# Patient Record
Sex: Female | Born: 1940 | Hispanic: Yes | Smoking: Former smoker
Health system: Southern US, Community
[De-identification: ages and names within clinical notes are randomized; demographics above are authoritative.]

## PROBLEM LIST (undated history)

## (undated) DIAGNOSIS — I10 Essential (primary) hypertension: Secondary | ICD-10-CM

## (undated) DIAGNOSIS — M810 Age-related osteoporosis without current pathological fracture: Secondary | ICD-10-CM

## (undated) HISTORY — PX: DILATION AND CURETTAGE OF UTERUS: SHX78

## (undated) HISTORY — PX: TONSILLECTOMY: SUR1361

---

## 2018-02-11 ENCOUNTER — Encounter (HOSPITAL_COMMUNITY): Payer: Self-pay | Admitting: *Deleted

## 2018-02-11 ENCOUNTER — Inpatient Hospital Stay (HOSPITAL_COMMUNITY)
Admission: EM | Admit: 2018-02-11 | Discharge: 2018-02-14 | DRG: 544 | Disposition: A | Discharge status: Skilled Nursing Facility | Payer: Medicare Other | Attending: Internal Medicine | Admitting: Internal Medicine

## 2018-02-11 ENCOUNTER — Other Ambulatory Visit: Payer: Self-pay

## 2018-02-11 ENCOUNTER — Emergency Department (HOSPITAL_COMMUNITY): Payer: Medicare Other

## 2018-02-11 DIAGNOSIS — M80872A Other osteoporosis with current pathological fracture, left ankle and foot, initial encounter for fracture: Secondary | ICD-10-CM | POA: Diagnosis present

## 2018-02-11 DIAGNOSIS — Z87891 Personal history of nicotine dependence: Secondary | ICD-10-CM | POA: Diagnosis not present

## 2018-02-11 DIAGNOSIS — W010XXA Fall on same level from slipping, tripping and stumbling without subsequent striking against object, initial encounter: Secondary | ICD-10-CM | POA: Diagnosis present

## 2018-02-11 DIAGNOSIS — I10 Essential (primary) hypertension: Secondary | ICD-10-CM | POA: Diagnosis present

## 2018-02-11 DIAGNOSIS — Z87310 Personal history of (healed) osteoporosis fracture: Secondary | ICD-10-CM

## 2018-02-11 DIAGNOSIS — Z885 Allergy status to narcotic agent status: Secondary | ICD-10-CM | POA: Diagnosis not present

## 2018-02-11 DIAGNOSIS — W19XXXA Unspecified fall, initial encounter: Secondary | ICD-10-CM

## 2018-02-11 DIAGNOSIS — M80051A Age-related osteoporosis with current pathological fracture, right femur, initial encounter for fracture: Secondary | ICD-10-CM | POA: Diagnosis not present

## 2018-02-11 DIAGNOSIS — M80851A Other osteoporosis with current pathological fracture, right femur, initial encounter for fracture: Secondary | ICD-10-CM | POA: Diagnosis present

## 2018-02-11 DIAGNOSIS — Z79899 Other long term (current) drug therapy: Secondary | ICD-10-CM | POA: Diagnosis not present

## 2018-02-11 DIAGNOSIS — S92355A Nondisplaced fracture of fifth metatarsal bone, left foot, initial encounter for closed fracture: Secondary | ICD-10-CM | POA: Diagnosis not present

## 2018-02-11 DIAGNOSIS — T402X5A Adverse effect of other opioids, initial encounter: Secondary | ICD-10-CM | POA: Diagnosis not present

## 2018-02-11 DIAGNOSIS — S32591A Other specified fracture of right pubis, initial encounter for closed fracture: Secondary | ICD-10-CM

## 2018-02-11 DIAGNOSIS — S329XXA Fracture of unspecified parts of lumbosacral spine and pelvis, initial encounter for closed fracture: Secondary | ICD-10-CM | POA: Diagnosis present

## 2018-02-11 DIAGNOSIS — Y92230 Patient room in hospital as the place of occurrence of the external cause: Secondary | ICD-10-CM | POA: Diagnosis not present

## 2018-02-11 DIAGNOSIS — K5900 Constipation, unspecified: Secondary | ICD-10-CM | POA: Diagnosis not present

## 2018-02-11 DIAGNOSIS — R110 Nausea: Secondary | ICD-10-CM | POA: Diagnosis not present

## 2018-02-11 DIAGNOSIS — M80072A Age-related osteoporosis with current pathological fracture, left ankle and foot, initial encounter for fracture: Secondary | ICD-10-CM | POA: Diagnosis not present

## 2018-02-11 HISTORY — DX: Essential (primary) hypertension: I10

## 2018-02-11 HISTORY — DX: Age-related osteoporosis without current pathological fracture: M81.0

## 2018-02-11 LAB — I-STAT CHEM 8, ED
BUN: 21 mg/dL (ref 8–23)
Calcium, Ion: 1.19 mmol/L (ref 1.15–1.40)
Chloride: 98 mmol/L (ref 98–111)
Creatinine, Ser: 0.8 mg/dL (ref 0.44–1.00)
GLUCOSE: 102 mg/dL — AB (ref 70–99)
HEMATOCRIT: 41 % (ref 36.0–46.0)
Hemoglobin: 13.9 g/dL (ref 12.0–15.0)
Potassium: 4 mmol/L (ref 3.5–5.1)
SODIUM: 137 mmol/L (ref 135–145)
TCO2: 31 mmol/L (ref 22–32)

## 2018-02-11 LAB — CBC WITH DIFFERENTIAL/PLATELET
Abs Immature Granulocytes: 0.1 10*3/uL (ref 0.0–0.1)
BASOS ABS: 0 10*3/uL (ref 0.0–0.1)
BASOS PCT: 0 %
EOS ABS: 0.1 10*3/uL (ref 0.0–0.7)
EOS PCT: 0 %
HCT: 41.3 % (ref 36.0–46.0)
HEMOGLOBIN: 13.4 g/dL (ref 12.0–15.0)
Immature Granulocytes: 1 %
Lymphocytes Relative: 10 %
Lymphs Abs: 1.2 10*3/uL (ref 0.7–4.0)
MCH: 30.7 pg (ref 26.0–34.0)
MCHC: 32.4 g/dL (ref 30.0–36.0)
MCV: 94.5 fL (ref 78.0–100.0)
MONO ABS: 0.6 10*3/uL (ref 0.1–1.0)
Monocytes Relative: 5 %
Neutro Abs: 10.6 10*3/uL — ABNORMAL HIGH (ref 1.7–7.7)
Neutrophils Relative %: 84 %
PLATELETS: 234 10*3/uL (ref 150–400)
RBC: 4.37 MIL/uL (ref 3.87–5.11)
RDW: 14.3 % (ref 11.5–15.5)
WBC: 12.6 10*3/uL — AB (ref 4.0–10.5)

## 2018-02-11 MED ORDER — FENTANYL CITRATE (PF) 100 MCG/2ML IJ SOLN
50.0000 ug | Freq: Once | INTRAMUSCULAR | Status: AC
  Administered 2018-02-11: 50 ug via INTRAVENOUS
  Filled 2018-02-11: qty 2

## 2018-02-11 MED ORDER — SENNA 8.6 MG PO TABS
1.0000 | ORAL_TABLET | Freq: Two times a day (BID) | ORAL | Status: DC
  Administered 2018-02-12 – 2018-02-13 (×3): 8.6 mg via ORAL
  Filled 2018-02-11 (×3): qty 1

## 2018-02-11 MED ORDER — HYDROCHLOROTHIAZIDE 25 MG PO TABS
25.0000 mg | ORAL_TABLET | Freq: Every day | ORAL | Status: DC
  Administered 2018-02-12 – 2018-02-14 (×3): 25 mg via ORAL
  Filled 2018-02-11 (×3): qty 1

## 2018-02-11 MED ORDER — POLYETHYLENE GLYCOL 3350 17 G PO PACK
17.0000 g | PACK | Freq: Every day | ORAL | Status: DC | PRN
  Administered 2018-02-13 – 2018-02-14 (×2): 17 g via ORAL
  Filled 2018-02-11 (×2): qty 1

## 2018-02-11 MED ORDER — LISINOPRIL 20 MG PO TABS
20.0000 mg | ORAL_TABLET | Freq: Every day | ORAL | Status: DC
Start: 2018-02-12 — End: 2018-02-14
  Administered 2018-02-12 – 2018-02-14 (×3): 20 mg via ORAL
  Filled 2018-02-11 (×3): qty 1

## 2018-02-11 MED ORDER — ENOXAPARIN SODIUM 40 MG/0.4ML ~~LOC~~ SOLN
40.0000 mg | SUBCUTANEOUS | Status: DC
  Administered 2018-02-12 – 2018-02-13 (×2): 40 mg via SUBCUTANEOUS
  Filled 2018-02-11 (×2): qty 0.4

## 2018-02-11 MED ORDER — ONDANSETRON HCL 4 MG/2ML IJ SOLN
4.0000 mg | Freq: Four times a day (QID) | INTRAMUSCULAR | Status: DC | PRN
  Administered 2018-02-11: 4 mg via INTRAVENOUS
  Filled 2018-02-11: qty 2

## 2018-02-11 MED ORDER — HYDROCODONE-ACETAMINOPHEN 10-325 MG PO TABS
0.5000 | ORAL_TABLET | ORAL | Status: DC | PRN
  Administered 2018-02-12: 1 via ORAL
  Administered 2018-02-12: 0.5 via ORAL
  Administered 2018-02-12: 1 via ORAL
  Administered 2018-02-12: 0.5 via ORAL
  Administered 2018-02-13 (×2): 1 via ORAL
  Administered 2018-02-13: 0.5 via ORAL
  Filled 2018-02-11: qty 1
  Filled 2018-02-11: qty 2
  Filled 2018-02-11 (×5): qty 1

## 2018-02-11 MED ORDER — HYDROMORPHONE HCL 1 MG/ML IJ SOLN
0.5000 mg | INTRAMUSCULAR | Status: DC | PRN
  Administered 2018-02-11: 0.5 mg via INTRAVENOUS
  Filled 2018-02-11: qty 1

## 2018-02-11 MED ORDER — ONDANSETRON HCL 4 MG PO TABS: 4.0000 mg | ORAL_TABLET | Freq: Four times a day (QID) | ORAL | Status: DC | PRN

## 2018-02-11 MED ORDER — LISINOPRIL-HYDROCHLOROTHIAZIDE 20-25 MG PO TABS: 1.0000 | ORAL_TABLET | Freq: Every day | ORAL | Status: DC

## 2018-02-11 NOTE — Progress Notes (Signed)
Orthopedic Tech Progress Note Patient Details:  Angelica ChessmanMaria Gutterman 12/09/1940 [id number]  Ortho Devices Type of Ortho Device: Postop shoe/boot Ortho Device/Splint Location: lle Ortho Device/Splint Interventions: Application   Post Interventions Patient Tolerated: Well Instructions Provided: Care of device   Nikki DomCrawford, Tonio Seider 02/11/2018, 4:46 PM

## 2018-02-11 NOTE — ED Triage Notes (Signed)
Pt in via [facility] from home, pt reports falling from standing position today onto Curb, family got the pt up and drove her to the house, PTAR was called and pt was brought in c/o R hip pain, vitals WNL, A&O x4

## 2018-02-11 NOTE — Consult Note (Signed)
Reason for Consult:Pelvic/foot injuries Referring Physician: E Jimmey RalphRees  Carnell Huffman is an 77 y.o. female.  HPI: Pamela HesselbachMaria stepped off a curb and twisted her ankle and fell onto her right side. She was unable to get up or ambulate and had significant left foot pain. She was brought to the ED where x-rays showed right pubic rami fxs and a left 5th MT base fx and orthopedic surgery was consulted.  Past Medical History:  Diagnosis Date  . Hypertension   . Osteoporosis     Past Surgical History:  Procedure Laterality Date  . DILATION AND CURETTAGE OF UTERUS    . TONSILLECTOMY      No family history on file.  Social History:  reports that she quit smoking about 29 years ago. Her smoking use included cigarettes. She has never used smokeless tobacco. She reports that she drank alcohol. She reports that she does not use drugs.  Allergies:  Allergies  Allergen Reactions  . Codeine Nausea Only    Medications: I have reviewed the patient's current medications.  Results for orders placed or performed during the hospital encounter of 02/11/18 (from the past 48 hour(s))  CBC with Differential/Platelet     Status: Abnormal   Collection Time: 02/11/18 12:49 PM  Result Value Ref Range   WBC 12.6 (H) 4.0 - 10.5 K/uL   RBC 4.37 3.87 - 5.11 MIL/uL   Hemoglobin 13.4 12.0 - 15.0 g/dL   HCT 47.841.3 29.536.0 - 62.146.0 %   MCV 94.5 78.0 - 100.0 fL   MCH 30.7 26.0 - 34.0 pg   MCHC 32.4 30.0 - 36.0 g/dL   RDW 30.814.3 65.711.5 - 84.615.5 %   Platelets 234 150 - 400 K/uL   Neutrophils Relative % 84 %   Neutro Abs 10.6 (H) 1.7 - 7.7 K/uL   Lymphocytes Relative 10 %   Lymphs Abs 1.2 0.7 - 4.0 K/uL   Monocytes Relative 5 %   Monocytes Absolute 0.6 0.1 - 1.0 K/uL   Eosinophils Relative 0 %   Eosinophils Absolute 0.1 0.0 - 0.7 K/uL   Basophils Relative 0 %   Basophils Absolute 0.0 0.0 - 0.1 K/uL   Immature Granulocytes 1 %   Abs Immature Granulocytes 0.1 0.0 - 0.1 K/uL    Comment: Performed at [facility],  [address] [place], [place] [address]  I-stat chem 8, ed     Status: Abnormal   Collection Time: 02/11/18  1:42 PM  Result Value Ref Range   Sodium 137 135 - 145 mmol/L   Potassium 4.0 3.5 - 5.1 mmol/L   Chloride 98 98 - 111 mmol/L   BUN 21 8 - 23 mg/dL   Creatinine, Ser 2.840.80 0.44 - 1.00 mg/dL   Glucose, Bld 132102 (H) 70 - 99 mg/dL   Calcium, Ion 4.401.19 1.021.15 - 1.40 mmol/L   TCO2 31 22 - 32 mmol/L   Hemoglobin 13.9 12.0 - 15.0 g/dL   HCT 72.541.0 36.636.0 - 44.046.0 %    Dg Foot Complete Left  Result Date: 02/11/2018 CLINICAL DATA:  77 year old who fell and complains of pain involving the LATERAL LEFT foot localizing to the fifth metatarsal region. Associated bruising and swelling. Initial encounter. EXAM: LEFT FOOT - COMPLETE 3+ VIEW COMPARISON:  None. FINDINGS: Acute nondisplaced fracture involving the base of the fifth metatarsal. No fractures elsewhere. Mild joint space narrowing and associated spurring involving the first MTP joint. Remaining joint spaces well preserved for patient age. Mild osseous demineralization. Very small plantar  calcaneal spur. IMPRESSION: Acute nondisplaced fracture involving the base of the fifth metatarsal. Electronically Signed   By: Hulan Saas M.D.   On: 02/11/2018 13:18   Dg Hip Unilat  With Pelvis 2-3 Views Right  Result Date: 02/11/2018 CLINICAL DATA:  77 year old who fell and complains of RIGHT hip pain. Initial encounter. EXAM: DG HIP (WITH OR WITHOUT PELVIS) 2-3V RIGHT COMPARISON:  None. FINDINGS: Acute fracture involving the RIGHT pubic bone with likely involvement of the MEDIAL aspect of the SUPERIOR and INFERIOR pubic rami. No fractures elsewhere. RIGHT hip joint anatomically aligned with mild joint space narrowing. Included AP pelvis demonstrates symmetric mild joint space narrowing in the contralateral LEFT hip. Sacroiliac joints intact. Symphysis pubis intact with degenerative changes. Degenerative changes involving the visualized LOWER lumbar spine.  IMPRESSION: Acute fracture involving the RIGHT pubic bone with likely involvement of the MEDIAL aspect of the SUPERIOR and INFERIOR pubic rami. Electronically Signed   By: Hulan Saas M.D.   On: 02/11/2018 13:16    Review of Systems  Constitutional: Negative for weight loss.  HENT: Negative for ear discharge, ear pain, hearing loss and tinnitus.   Eyes: Negative for blurred vision, double vision, photophobia and pain.  Respiratory: Negative for cough, sputum production and shortness of breath.   Cardiovascular: Negative for chest pain.  Gastrointestinal: Negative for abdominal pain, nausea and vomiting.  Genitourinary: Negative for dysuria, flank pain, frequency and urgency.  Musculoskeletal: Positive for joint pain (Left foot, pelvis). Negative for back pain, falls, myalgias and neck pain.  Neurological: Negative for dizziness, tingling, sensory change, focal weakness, loss of consciousness and headaches.  Endo/Heme/Allergies: Does not bruise/bleed easily.  Psychiatric/Behavioral: Negative for depression, memory loss and substance abuse. The patient is not nervous/anxious.    Blood pressure (!) 159/61, pulse 73, temperature 98.1 F (36.7 C), temperature source Oral, resp. rate 18, SpO2 97 %. Physical Exam  Constitutional: She appears well-developed and well-nourished. No distress.  HENT:  Head: Normocephalic and atraumatic.  Eyes: Conjunctivae are normal. Right eye exhibits no discharge. Left eye exhibits no discharge. No scleral icterus.  Neck: Normal range of motion.  Cardiovascular: Normal rate and regular rhythm.  Respiratory: Effort normal. No respiratory distress.  Musculoskeletal:  Pelvis--no traumatic wounds or rash, no ecchymosis, stable to manual stress, mild TTP with AP/lat compression  RLE No traumatic wounds or rash  Mod TTP lateral foot, ecchymotic  No knee or ankle effusion  Knee stable to varus/ valgus and anterior/posterior stress  Sens DPN, SPN, TN  intact  Motor EHL, ext, flex, evers 5/5  DP 2+, PT 1+, No significant edema  Neurological: She is alert.  Skin: Skin is warm and dry. She is not diaphoretic.  Psychiatric: She has a normal mood and affect. Her behavior is normal.    Assessment/Plan: Fall Right sup/inf rami fxs -- No treatment necessary beyond pain control. May WBAT RLE. Left 5th MT fx -- WBAT in post-op shoe. Should f/u with Dr. Magnus Ivan at discharge.    Freeman Caldron, PA-C Orthopedic Surgery [phone] 02/11/2018, 2:38 PM

## 2018-02-11 NOTE — H&P (Addendum)
Date: 02/11/2018               Patient Name:  Pamela Huffman MRN: [id number]  DOB: 1940-10-23 Age / Sex: 77 y.o., female   PCP: No primary care provider on file.              Medical Service: Internal Medicine Teaching Service              Attending Physician: Dr. Burns Spain, MD    First Contact: Lindie Spruce, Pamela  Pager: [phone]  Second Contact: Dr. Samuella Cota  Pager: [phone]            After Hours (After 5p/  First Contact Pager: [phone]  weekends / holidays): Second Contact Pager: [phone]   Chief Complaint: Hip and foot pain after fall   History of Present Illness: Pamela Huffman is a 24 yoF with a history of osteoporosis with 4 vertebral fractures in 2013, and hypertension who presents after twisting her foot on a curb and falling on her right hip. She states that this occurred 4-5 hours ago and that as a result of the fall, she injured her left foot and her right hip during the fall. She denies hitting her head, losing consciousness before or after the fall, feeling dizzy or like things were going black before she fell, palpitations or chest pain associated with the fall. She denies any diarrhea, vomiting or sickness in the days preceding her fall and states that before she fell, she was in her usual state of health. She claims she is currently unable to bear weight using her lower extremities. In 2013 she broke 4 vertebrae while "carrying boxes." She then took alendronate for 5 years until recently because she is on a year long break from therapy as indicated by her doctor.      In the ED, the patient was assessed and orthopedic surgery was consulted. A BMP and CBC were completely unremarkable and the patient was stable throughout her time in the ED. An xray of the patient's left foot revealed an acute nondisplaced fracture involving the base of the fifth metatarsal and xray of the right hip and pelvis revealed an acute fracture involving the right pubic bone with likely  involvement of the medical aspect of the superior and inferior pubic rami. The patient was given 2- 50 mg doses of fentanyl for pain management and admitted to the IMTS.    Meds:  Current Meds  Medication Sig  . lisinopril-hydrochlorothiazide (PRINZIDE,ZESTORETIC) 20-25 MG tablet Take 1 tablet by mouth daily.     Allergies: Allergies as of 02/11/2018 - Review Complete 02/11/2018  Allergen Reaction Noted  . Codeine Nausea Only 02/11/2018   Past Medical History:  Diagnosis Date  . Hypertension   . Osteoporosis     Family History: No know history of osteoporosis   Social History: The patient lives with her daughter and son in law and is full functional in all her ADLs at baseline. She does not smoke or use other tobacco products, and she does not drink alcohol or consume other drugs.   Review of Systems: A complete ROS was negative except as per HPI.   Physical Exam: Blood pressure (!) 159/61, pulse 73, temperature 98.1 F (36.7 C), temperature source Oral, resp. rate 18, SpO2 97 %. General: No acute distress, patient lying in bed not moving her lower extremities  Pulm: No increased work of breathing, CAB  CV: RRR, nMRG Abd: soft, BS+ Ext: Warm and well perfused, dorsalis  pedis and radial pulses present Msk: raised ecchymosis on lateral aspect of dorsal left foot, legs not internally rotated or shortened   EKG: personally reviewed my interpretation is sinus rhythm with regular axis and no signs of acute ischemia    Assessment & Plan by Problem: Active Problems:   Pelvic fracture (HCC)  #Pelvic Fracture  The patient presents after a mechanical fall and fracture of her 5th metatarsal and pubic bone. The patient denies LOC after the fall or syncope preceding it and does not have a concerning story, past medical history or ECG to suggest a more extensive workup for alternative causes of the fall. She has been seen by Orthopedics who have recommended no treatment beyond pain  control.  -PT/OT consult while inpatient to assess needs  -per ortho- patient can ambulate in post-op shoe on left foot  -outpatient follow up with Dr. Magnus IvanBlackman at discharge  -Hydrocodone 10 mg q4 prn with Dilaudid 0.5 mg IV q4 for breakthrough severe pain    -May need rehab placement    #Osteoporosis: History of fragility fracture in 2013, completed 5 years of alendronate therapy.  - will likely need to restart bisphosphonate therapy   #Hypertension  The patient has self reported history of hypertension. -Initiate home Losartan-hydrochlorothiazide 20-25 mg    FEN/GI: Full diet, senokot and miralax for constipation, Zofran prn for nausea  PPx: Lovenox   Dispo: Admit patient to Inpatient with expected length of stay greater than 2 midnights.  Signed: Lindie Spruceuningham, John, Medical Student 02/11/2018, 3:26 PM  Pager: [phone]  Attestation for Student Documentation:  I personally was present and performed or re-performed the history, physical exam and medical decision-making activities of this service and have verified that the service and findings are accurately documented in the student's note.  Reymundo PollGuilloud, Shaunta Oncale, MD 02/11/2018, 7:50 PM

## 2018-02-11 NOTE — ED Provider Notes (Signed)
[facility] EMERGENCY DEPARTMENT Provider Note   CSN: [id number] Arrival date & time: 02/11/18  1202     History   Chief Complaint Chief Complaint  Patient presents with  . Fall    HPI Pamela Huffman is a 77 y.o. female.  The history is provided by the patient and a significant other. No language interpreter was used.  Fall      77 year old female with hx of osteoporosis, HTN brought here via EMS from home for evaluation of a recent fall. Prior to arrival, pt was walking when she tripped on a curb, twisted her L foot and fell to the ground.  She did struck her face and injured her R hip.  She report 10/10 sharp non radiating pain to R hip and R groin, along with similar pain to L foot.  She was unable to ambulate afterward due to pain. She denies any headache, facial pain, neck pain, cp, sob, abd pain, or ankle pain.  No numbness or weakness.  No precipitating sxs prior to the fall.  She is not on any anticoagulant.  No specific treatment tried.   Past Medical History:  Diagnosis Date  . Hypertension   . Osteoporosis     There are no active problems to display for this patient.   Past Surgical History:  Procedure Laterality Date  . DILATION AND CURETTAGE OF UTERUS    . TONSILLECTOMY       OB History   None      Home Medications    Prior to Admission medications   Not on File    Family History No family history on file.  Social History Social History   Tobacco Use  . Smoking status: Former Smoker    Types: Cigarettes    Last attempt to quit: 07/22/1988    Years since quitting: 29.5  . Smokeless tobacco: Never Used  Substance Use Topics  . Alcohol use: Not Currently  . Drug use: Never     Allergies   Codeine   Review of Systems Review of Systems  All other systems reviewed and are negative.    Physical Exam Updated Vital Signs There were no vitals taken for this visit.  Physical Exam  Constitutional: She appears  well-developed and well-nourished. No distress.  HENT:  Head: Normocephalic and atraumatic.  Eyes: Conjunctivae are normal.  Neck: Normal range of motion. Neck supple.  Cardiovascular: Normal rate and regular rhythm.  Pulmonary/Chest: Effort normal and breath sounds normal.  Abdominal: Soft. There is no tenderness.  Musculoskeletal: She exhibits tenderness (R hip: tenderness to posteriolateral hip and R groin with decreased ROM 2/2 pain, no leg shortening or internal rotation. ).  L foot: moderate ecchymosis noted to lateral dorsum of foot overlying 5th MTP, ttp.  DP pulse palpable.  No tenderness to L ankle.   Neurological: She is alert.  Skin: No rash noted.  Psychiatric: She has a normal mood and affect.  Nursing note and vitals reviewed.    ED Treatments / Results  Labs (all labs ordered are listed, but only abnormal results are displayed) Labs Reviewed  CBC WITH DIFFERENTIAL/PLATELET - Abnormal; Notable for the following components:      Result Value   WBC 12.6 (*)    Neutro Abs 10.6 (*)    All other components within normal limits  I-STAT CHEM 8, ED - Abnormal; Notable for the following components:   Glucose, Bld 102 (*)    All other components within  normal limits  URINALYSIS, ROUTINE W REFLEX MICROSCOPIC    EKG EKG Interpretation  Date/Time:  Wednesday February 11 2018 13:41:02 EDT Ventricular Rate:  79 PR Interval:    QRS Duration: 82 QT Interval:  390 QTC Calculation: 448 R Axis:   31 Text Interpretation:  Sinus rhythm Borderline T abnormalities, diffuse leads no prior available for comparison Confirmed by Tilden Fossa [phone]) on 02/11/2018 1:49:01 PM   Radiology Dg Foot Complete Left  Result Date: 02/11/2018 CLINICAL DATA:  77 year old who fell and complains of pain involving the LATERAL LEFT foot localizing to the fifth metatarsal region. Associated bruising and swelling. Initial encounter. EXAM: LEFT FOOT - COMPLETE 3+ VIEW COMPARISON:  None. FINDINGS: Acute  nondisplaced fracture involving the base of the fifth metatarsal. No fractures elsewhere. Mild joint space narrowing and associated spurring involving the first MTP joint. Remaining joint spaces well preserved for patient age. Mild osseous demineralization. Very small plantar calcaneal spur. IMPRESSION: Acute nondisplaced fracture involving the base of the fifth metatarsal. Electronically Signed   By: Hulan Saas M.D.   On: 02/11/2018 13:18   Dg Hip Unilat  With Pelvis 2-3 Views Right  Result Date: 02/11/2018 CLINICAL DATA:  77 year old who fell and complains of RIGHT hip pain. Initial encounter. EXAM: DG HIP (WITH OR WITHOUT PELVIS) 2-3V RIGHT COMPARISON:  None. FINDINGS: Acute fracture involving the RIGHT pubic bone with likely involvement of the MEDIAL aspect of the SUPERIOR and INFERIOR pubic rami. No fractures elsewhere. RIGHT hip joint anatomically aligned with mild joint space narrowing. Included AP pelvis demonstrates symmetric mild joint space narrowing in the contralateral LEFT hip. Sacroiliac joints intact. Symphysis pubis intact with degenerative changes. Degenerative changes involving the visualized LOWER lumbar spine. IMPRESSION: Acute fracture involving the RIGHT pubic bone with likely involvement of the MEDIAL aspect of the SUPERIOR and INFERIOR pubic rami. Electronically Signed   By: Hulan Saas M.D.   On: 02/11/2018 13:16    Procedures Procedures (including critical care time)  Medications Ordered in ED Medications  fentaNYL (SUBLIMAZE) injection 50 mcg (50 mcg Intravenous Given 02/11/18 1254)     Initial Impression / Assessment and Plan / ED Course  I have reviewed the triage vital signs and the nursing notes.  Pertinent labs & imaging results that were available during my care of the patient were reviewed by me and considered in my medical decision making (see chart for details).     BP (!) 159/61 (BP Location: Right Arm)   Pulse 73   Temp 98.1 F (36.7 C)  (Oral)   Resp 18   SpO2 97%    Final Clinical Impressions(s) / ED Diagnoses   Final diagnoses:  Closed fracture of multiple pubic rami, right, initial encounter (HCC)  Nondisplaced fracture of fifth metatarsal bone, left foot, initial encounter for closed fracture  Fall, initial encounter    ED Discharge Orders    None     Patient had a mechanical fall earlier today and injured her right pelvic and left foot.  X-ray of the right hip and pelvis demonstrate acute fracture involving the right pubic bone with likely involvement of the medial aspect of the superior and inferior pubic rami.  This is a closed injury.  An x-ray of the left foot demonstrate acute nondisplaced fracture involving the base of the fifth metatarsal.  X-ray was reviewed with Dr. Madilyn Hook, orthopedics has been consulted.  I spoke with Earney Hamburg, PA-C who does not recommend pelvic CT scan at this time.  Patient made  aware of finding.  Postop shoe applied to left foot.  Plan to consult medicine for admission for further management of pelvic injury.  She would likely benefit from PT OT.  2:37 PM Appreciate consultation from internal medicine resident who agrees to see patient in the ER and will admit for further management.  Patient is made aware of plan and agrees with plan.   Fayrene Helperran, Johnnathan Hagemeister, PA-C 02/11/18 1446    Tilden Fossaees, Elizabeth, MD 02/12/18 [id number]

## 2018-02-12 DIAGNOSIS — S32591A Other specified fracture of right pubis, initial encounter for closed fracture: Secondary | ICD-10-CM

## 2018-02-12 DIAGNOSIS — S92355A Nondisplaced fracture of fifth metatarsal bone, left foot, initial encounter for closed fracture: Secondary | ICD-10-CM

## 2018-02-12 LAB — BASIC METABOLIC PANEL
Anion gap: 9 (ref 5–15)
BUN: 15 mg/dL (ref 8–23)
CHLORIDE: 98 mmol/L (ref 98–111)
CO2: 28 mmol/L (ref 22–32)
Calcium: 9 mg/dL (ref 8.9–10.3)
Creatinine, Ser: 0.81 mg/dL (ref 0.44–1.00)
GFR calc Af Amer: >60 mL/min (ref >60)
GFR calc non Af Amer: >60 mL/min (ref >60)
Glucose, Bld: 118 mg/dL — ABNORMAL HIGH (ref 70–99)
Potassium: 4.4 mmol/L (ref 3.5–5.1)
Sodium: 135 mmol/L (ref 135–145)

## 2018-02-12 LAB — CBC
HCT: 37.7 % (ref 36.0–46.0)
Hemoglobin: 12.2 g/dL (ref 12.0–15.0)
MCH: 30.3 pg (ref 26.0–34.0)
MCHC: 32.4 g/dL (ref 30.0–36.0)
MCV: 93.5 fL (ref 78.0–100.0)
Platelets: 197 10*3/uL (ref 150–400)
RBC: 4.03 MIL/uL (ref 3.87–5.11)
RDW: 14.2 % (ref 11.5–15.5)
WBC: 8.2 10*3/uL (ref 4.0–10.5)

## 2018-02-12 NOTE — Progress Notes (Addendum)
  Date: 02/12/2018  Patient name: Pamela Huffman  Medical record number: [id number]  Date of birth: 04/11/1941   I have seen and evaluated Pamela Huffman and discussed their care with the Residency Team. Pamela Huffman is a 77 yo community dwelling independent female with osteoporosis.  She had a mechanical fall, twisting her foot and falling on her right hip.  She was unable to weight-bear following the fall.  In the ED, x-rays revealed an acute nondisplaced fracture of the fifth metatarsal base and an acute fracture of the right pubic bone.  She has been on alendronate for 5 years but recently, her PCP recommended that she take a drug holiday and has been off of the medication.  She denies any long-term steroid use or family history of osteoporosis.  She went through menopause at the age of 77.  She did previously smoke tobacco.  This morning, her pain is somewhat controlled on hydrocodone which does not cause nausea.  PMHx, Fam Hx, and/or Soc Hx : Osteoporosis and hypertension.  Vitals:   02/12/18 0334 02/12/18 1335  BP: 133/70 137/60  Pulse: 70 77  Resp: 16 16  Temp: 98.8 F (37.1 C) 99.4 F (37.4 C)  SpO2: 92% 96%  NAD except when repositions in bed HRRR no MRG LCTAB with good air flow ABD + BS Ext no edema Skin warm and dry  I personally viewed the EKG and confirmed my reading with the official read. Sinus, nl axis, nl intervals, NSTW changes  Assessment and Plan: I have seen and evaluated the patient as outlined above. I agree with the formulated Assessment and Plan as detailed in the residents' note, with the following changes:  Pamela Huffman is a 77 yo community dwelling independent female with osteoporosis  Who had a mechanical fall resulting in an acute nondisplaced fracture right pubic bone.  She has been evaluated by orthopedics who recommends pain control of the fifth metatarsal base and the, physical therapy, and WBAT.  Her pain is being well controlled with hydrocodone and we  have physical therapy and Occupational Therapy consulted to help with disposition planning.  She has completed 5 years of alendronate therapy and recently started a drug holiday.  Despite bisphosphonate therapy, she has not presented with a another fragility fracture.  The patient is quite knowledgeable regarding her alendronate in the recent discontinuation of that medication so we have to assume that she was compliant with taking the alendronate.  Her calcium is normal, although we do not have an albumin to correct it, and therefore hyperparathyroidism is unlikely.  Her risk factors are limited to early age of menopause and smoking.  After continued fractures despite bisphosphonate therapy the next step is controversial -alendronate could be restarted versus starting an anabolic agent.  It might be beneficial to know her baseline BMD and may be even repeat her BMD as an outpatient.  She has a good relationship with her PCP and we will leave the choice whether to resume bisphosphonate or switch to an anabolic agent to her PCP.  1.  Fragility fracture -continue hydrocodone, consult PT OT, work on disposition planning.  2. Osteoporosis - she will need either resumption of her bisphosphonate or starting an anabolic agent.  We will defer to her PCP along with the utility of rechecking her BMD.  Check a vitamin D level.  Burns SpainButcher, Abigale Dorow A, MD 7/25/20191:49 PM

## 2018-02-12 NOTE — Evaluation (Addendum)
Physical Therapy Evaluation Patient Details Name: Pamela Huffman MRN: [id number] DOB: 1941-04-18 Today's Date: 02/12/2018   History of Present Illness  Pt is a 77 y.o. female presenting after a fall. Pt found to have nondisplaced fracture of the L fifth metatarsal base and R-sided pelvic pubic rami fractures. PMHx: HTN, Osteoporosis.  Clinical Impression  Pt admitted with above diagnosis. Pt currently with functional limitations due to the deficits listed below (see PT Problem List). Pt is very motivated to work with PT, she is active and independent at her baseline; pt able to walk short, functional distance in room with RW and min assist today, limited by pain; pt will benefit from therapy in SNF setting post acute;   Pt will benefit from skilled PT in acute setting  to increase their independence and safety with mobility to allow discharge to the venue listed below.       Follow Up Recommendations SNF    Equipment Recommendations  None recommended by PT(defer to next venue)    Recommendations for Other Services       Precautions / Restrictions Precautions Precautions: Fall Precaution Comments: Post op shoe L foot--pt reports it makes her pain worse Restrictions Weight Bearing Restrictions: No RLE Weight Bearing: Weight bearing as tolerated LLE Weight Bearing: Weight bearing as tolerated      Mobility  Bed Mobility               General bed mobility comments: Pt sitting EOB upon arrival  Transfers Overall transfer level: Needs assistance Equipment used: Rolling walker (2 wheeled) Transfers: Sit to/from Stand Sit to Stand: Min assist Stand pivot transfers: Min assist       General transfer comment: Cues for hand placement and technique with RW. Pt painful but only requiring min assist to boost up from EOB and for short distance functional mobility in room  Ambulation/Gait Ambulation/Gait assistance: Min assist Gait Distance (Feet): 10 Feet Assistive device:  Rolling walker (2 wheeled) Gait Pattern/deviations: Step-to pattern;Decreased step length - right;Decreased weight shift to right;Antalgic     General Gait Details: verbal cues for sequence,  RW safety and position; gait distance limited by pain   Stairs            Wheelchair Mobility    Modified Rankin (Stroke Patients Only)       Balance Overall balance assessment: Needs assistance;History of Falls Sitting-balance support: Feet supported;No upper extremity supported Sitting balance-Leahy Scale: Good     Standing balance support: Bilateral upper extremity supported Standing balance-Leahy Scale: Poor Standing balance comment: RW for support                             Pertinent Vitals/Pain Pain Assessment: Faces Faces Pain Scale: Hurts even more Pain Location: R hip Pain Descriptors / Indicators: Grimacing;Guarding;Aching Pain Intervention(s): Monitored during session;Repositioned    Home Living Family/patient expects to be discharged to:: Private residence Living Arrangements: Alone(daughter next door) Available Help at Discharge: Family(near 24/7) Type of Home: Apartment       Home Layout: Two level;Bed/bath upstairs;1/2 bath on main level Home Equipment: None      Prior Function Level of Independence: Independent               Hand Dominance        Extremity/Trunk Assessment   Upper Extremity Assessment Upper Extremity Assessment: Overall WFL for tasks assessed;Defer to OT evaluation    Lower Extremity Assessment Lower Extremity Assessment:  RLE deficits/detail;LLE deficits/detail RLE Deficits / Details: AAROM WFL, strength not fully tested d/t pain, at least 2+ to 3/5 LLE Deficits / Details: grossly WFL    Cervical / Trunk Assessment Cervical / Trunk Assessment: Normal  Communication   Communication: Prefers language other than English(understands some [place], daughter translates also)  Cognition Arousal/Alertness:  Awake/alert Behavior During Therapy: WFL for tasks assessed/performed Overall Cognitive Status: Within Functional Limits for tasks assessed                                        General Comments      Exercises General Exercises - Lower Extremity Ankle Circles/Pumps: AROM;Both;10 reps Quad Sets: AROM;Both;10 reps Heel Slides: AROM;AAROM;Both;10 reps;Other (comment) Heel Slides Limitations: instructed in use of sheet loop to self assist RLE, educated to work only within limits of pain and not overdo it   Assessment/Plan    PT Assessment Patient needs continued PT services  PT Problem List Decreased strength;Decreased activity tolerance;Decreased balance;Decreased knowledge of use of DME;Decreased mobility;Pain PT visit diagnosis: pain right, other abnormalities of gait and mobility      PT Treatment Interventions DME instruction;Gait training;Functional mobility training;Therapeutic activities;Therapeutic exercise;Patient/family education    PT Goals (Current goals can be found in the Care Plan section)  Acute Rehab PT Goals Patient Stated Goal: decrease pain, get better PT Goal Formulation: With patient/family Time For Goal Achievement: 02/26/18 Potential to Achieve Goals: Good    Frequency Min 3X/week   Barriers to discharge        Co-evaluation               AM-PAC PT "6 Clicks" Daily Activity  Outcome Measure Difficulty turning over in bed (including adjusting bedclothes, sheets and blankets)?: A Lot Difficulty moving from lying on back to sitting on the side of the bed? : A Lot Difficulty sitting down on and standing up from a chair with arms (e.g., wheelchair, bedside commode, etc,.)?: Unable Help needed moving to and from a bed to chair (including a wheelchair)?: A Little Help needed walking in hospital room?: A Little Help needed climbing 3-5 steps with a railing? : A Lot 6 Click Score: 13    End of Session Equipment Utilized During  Treatment: Gait belt Activity Tolerance: Patient tolerated treatment well Patient left: in chair;with call bell/phone within reach;with family/visitor present        Time: [id number] PT Time Calculation (min) (ACUTE ONLY): 19 min   Charges:   PT Evaluation $PT Eval Low Complexity: 1 Low          Drucilla Chaletara Beldon Nowling, PT Pager: [phone] 02/12/2018   [facility] Hospital[facility]ILLIAMS,Pamela Panetta 02/12/2018, 4:04 PM

## 2018-02-12 NOTE — Discharge Instructions (Signed)
Increase your activity as comfort allows. Full weight bearing as tolerated. Expect significant right hip/pelvic pain and left foot pain.

## 2018-02-12 NOTE — Progress Notes (Signed)
Patient ID: Pamela ChessmanMaria Huffman, female   DOB: 05/18/1941, 77 y.o.   MRN: [id number] I have seen and examined the patient as well as reviewed her x-rays.  She has right-sided pelvic pubic rami fractures and a non-displaced left 5th metatarsal base fracture.  These are both stable injuries and can be treated conservatively with supportive care - pain meds, therapy for mobility, and WBAT.  No further recommendations from Ortho standpoint.

## 2018-02-12 NOTE — Plan of Care (Signed)
  Problem: Education: Goal: Knowledge of General Education information will improve Description Including pain rating scale, medication(s)/side effects and non-pharmacologic comfort measures Outcome: Progressing   Problem: Health Behavior/Discharge Planning: Goal: Ability to manage health-related needs will improve Outcome: Progressing   Problem: Clinical Measurements: Goal: Ability to maintain clinical measurements within normal limits will improve Outcome: Progressing Goal: Will remain free from infection Outcome: Progressing Goal: Diagnostic test results will improve Outcome: Progressing Goal: Respiratory complications will improve Outcome: Progressing Goal: Cardiovascular complication will be avoided Outcome: Progressing   Problem: Activity: Goal: Risk for activity intolerance will decrease Outcome: Progressing   Problem: Nutrition: Goal: Adequate nutrition will be maintained Outcome: Progressing   Problem: Coping: Goal: Level of anxiety will decrease Outcome: Progressing   Problem: Elimination: Goal: Will not experience complications related to bowel motility Outcome: Progressing Goal: Will not experience complications related to urinary retention Outcome: Progressing   Problem: Pain Managment: Goal: General experience of comfort will improve Outcome: Progressing   Problem: Safety: Goal: Ability to remain free from injury will improve Outcome: Progressing   Problem: Skin Integrity: Goal: Risk for impaired skin integrity will decrease Outcome: Progressing   Problem: Activity: Goal: Ability to ambulate and perform ADLs will improve Outcome: Progressing   Problem: Pain Management: Goal: Pain level will decrease Outcome: Progressing

## 2018-02-12 NOTE — Evaluation (Signed)
Occupational Therapy Evaluation Patient Details Name: Pamela Huffman MRN: [id number] DOB: 05/24/1941 Today's Date: 02/12/2018    History of Present Illness Pt is a 77 y.o. female presenting after a fall. Pt found to have nondisplaced fracture of the L fifth metatarsal base and R-sided pelvic pubic rami fractures. PMHx: HTN, Osteoporosis.   Clinical Impression   Pt reports she was independent with ADL and mobility PTA. Currently pt min assist for functional mobility and mod assist for LB ADL. Recommending short term SNF for follow up to maximize independence and safety with ADL and functional mobility prior to return home with family assist. Pt would benefit from continued skilled OT to address established goals.    Follow Up Recommendations  SNF    Equipment Recommendations  3 in 1 bedside commode    Recommendations for Other Services       Precautions / Restrictions Precautions Precautions: Fall Precaution Comments: Post op shoe L foot Restrictions Weight Bearing Restrictions: Yes RLE Weight Bearing: Weight bearing as tolerated LLE Weight Bearing: Weight bearing as tolerated      Mobility Bed Mobility               General bed mobility comments: Pt sitting EOB upon arrival  Transfers Overall transfer level: Needs assistance Equipment used: Rolling walker (2 wheeled) Transfers: Sit to/from [employer] Sit to Stand: Min assist Stand pivot transfers: Min assist       General transfer comment: Cues for hand placement and technique with RW. Pt painful but only requiring min assist to boost up from EOB and for short distance functional mobility in room    Balance Overall balance assessment: Needs assistance;History of Falls Sitting-balance support: Feet supported Sitting balance-Leahy Scale: Good     Standing balance support: Bilateral upper extremity supported Standing balance-Leahy Scale: Poor Standing balance comment: RW for support                            ADL either performed or assessed with clinical judgement   ADL Overall ADL's : Needs assistance/impaired Eating/Feeding: Independent;Sitting   Grooming: Set up;Supervision/safety;Sitting   Upper Body Bathing: Set up;Supervision/ safety;Sitting   Lower Body Bathing: Moderate assistance;Sit to/from stand   Upper Body Dressing : Set up;Supervision/safety;Sitting   Lower Body Dressing: Moderate assistance;Sit to/from stand   Toilet Transfer: Minimal Science writer Details (indicate cue type and reason): Pt getting off BSC with daughter upon arrival         Functional mobility during ADLs: Minimal assistance;Rolling walker       Vision         Perception     Praxis      Pertinent Vitals/Pain Pain Assessment: Faces Faces Pain Scale: Hurts even more Pain Location: R hip Pain Descriptors / Indicators: Grimacing;Guarding;Aching Pain Intervention(s): Monitored during session;Repositioned     Hand Dominance     Extremity/Trunk Assessment Upper Extremity Assessment Upper Extremity Assessment: Overall WFL for tasks assessed   Lower Extremity Assessment Lower Extremity Assessment: Defer to PT evaluation   Cervical / Trunk Assessment Cervical / Trunk Assessment: Normal   Communication Communication Communication: Prefers language other than English(understands English but it is not her first language)   Cognition Arousal/Alertness: Awake/alert Behavior During Therapy: WFL for tasks assessed/performed Overall Cognitive Status: Within Functional Limits for tasks assessed  General Comments       Exercises     Shoulder Instructions      Home Living Family/patient expects to be discharged to:: Private residence Living Arrangements: Alone(daughter lives next door) Available Help at Discharge: Family(near 24/7) Type of Home: Apartment       Home  Layout: Two level;Bed/bath upstairs;1/2 bath on main level Alternate Level Stairs-Number of Steps: flight   Bathroom Shower/Tub: Producer, television/film/videoWalk-in shower   Bathroom Toilet: Standard     Home Equipment: None          Prior Functioning/Environment Level of Independence: Independent                 OT Problem List: Impaired balance (sitting and/or standing);Decreased knowledge of use of DME or AE;Decreased knowledge of precautions;Pain      OT Treatment/Interventions: Self-care/ADL training;Energy conservation;DME and/or AE instruction;Therapeutic activities;Balance training;Patient/family education    OT Goals(Current goals can be found in the care plan section) Acute Rehab OT Goals Patient Stated Goal: decrease pain, get better OT Goal Formulation: With patient/family Time For Goal Achievement: 02/26/18 Potential to Achieve Goals: Good ADL Goals Pt Will Perform Lower Body Bathing: with supervision;sit to/from stand Pt Will Perform Lower Body Dressing: with supervision;sit to/from stand Pt Will Transfer to Toilet: with supervision;ambulating;bedside commode Pt Will Perform Toileting - Clothing Manipulation and hygiene: with supervision;sit to/from stand  OT Frequency: Min 2X/week   Barriers to D/C: Inaccessible home environment  flight up to bed/bathroom       Co-evaluation              AM-PAC PT "6 Clicks" Daily Activity     Outcome Measure Help from another person eating meals?: None Help from another person taking care of personal grooming?: A Little Help from another person toileting, which includes using toliet, bedpan, or urinal?: A Little Help from another person bathing (including washing, rinsing, drying)?: A Lot Help from another person to put on and taking off regular upper body clothing?: A Little Help from another person to put on and taking off regular lower body clothing?: A Lot 6 Click Score: 17   End of Session Equipment Utilized During Treatment:  Gait belt;Rolling walker  Activity Tolerance: Patient tolerated treatment well Patient left: in chair;with call bell/phone within reach;with family/visitor present(pt with PT)  OT Visit Diagnosis: Unsteadiness on feet (R26.81);History of falling (Z91.81);Pain Pain - Right/Left: Right Pain - part of body: Hip                Time: [id number] OT Time Calculation (min): 25 min Charges:  OT General Charges $OT Visit: 1 Visit OT Evaluation $OT Eval Moderate Complexity: 1 Mod OT Treatments $Self Care/Home Management : 8-22 mins  Pamela Huffman, M.S., Pamela Huffman Acute Rehab Department: [phone]  Pamela Huffman 02/12/2018, 2:33 PM

## 2018-02-12 NOTE — Progress Notes (Addendum)
   Subjective:  Ms. Pamela Huffman is in her first day of hospitalization after a mechanical fall and fractures of her left 5th metatarsal and right pubic bone fracture. She mentioned that Dilaudid made her nauseous last night, but that hydrocodone gave her good pain control with less nausea. The patient denies known history of fractures in her mother or her grandmother. She states that she went through menopause at the age of 77.     Objective:  Vital signs in last 24 hours: Vitals:   02/11/18 1600 02/11/18 1943 02/12/18 0334 02/12/18 1335  BP:  (!) 146/70 133/70 137/60  Pulse:  81 70 77  Resp:  16 16 16   Temp:  98.9 F (37.2 C) 98.8 F (37.1 C) 99.4 F (37.4 C)  TempSrc:  Oral Oral Oral  SpO2:  100% 92% 96%  Weight: 73.6 kg (162 lb 4.1 oz)     Height:       Weight change:   Intake/Output Summary (Last 24 hours) at 02/12/2018 1503 Last data filed at 02/12/2018 0900 Gross per 24 hour  Intake 480 ml  Output -  Net 480 ml   Physical Exam: General: No acute distress, pleasant  Pulm: No increased work of breathing, CAB CV: RRR, nMRG  Abd: BS+, soft, non-tender  Ext: warn and well perfused, posterior tibial pulses present bilaterally, no edema  Neuro: no focal defects    Assessment/Plan:  Active Problems:   Pelvic fracture (HCC)   Closed fracture of multiple pubic rami, right, initial encounter (HCC)   Nondisplaced fracture of fifth metatarsal bone, left foot, initial encounter for closed fracture  #Fracture of 5th Metatarsal and Right Pelvis  The patient is an otherwise healthy 77 year old female with her second fragility fracture. Ortho has evaluated these injuries as stable and recommended conservative treatment with pain management and support of mobility. We will follow PT and OT's recommendations for therapy and placement. -Pain control Norco 10-325 0.5-2 tablets q4 prn  -Zofran 4mg  q6 for nausea -OT recomends SNF placement- follow up with PT recs and patient preference    #Osteoperosis with Repeat Fragility Fracture The patient has a history of fracture of 4 vertebrae in 2013 and now presents with her second fragility fracture. She was previously on 5 years of alendronate therapy and is currently on her year off of this medication per her PCP. This should not represent loss of BMD post-bisphosphonate therapy given the short time off of this medication. It could therefore be seen as treatment failure with Bisphosphonate and possibly be an indication for an anabolic agent. Alternatively, it could represent the impetus to reinitiate alendronate, which has been shown to provide benefit with limited harm when used for 10 years. The patient has a good relationship with her PCP and the decision on this long-term medication management can be made as an outpatient.   #Hypertension: The patient has self reported history of hypertension. -Initiate home Losartan-hydrochlorothiazide 20-25 mg    FEN/GI: Full diet, senokot and miralax for constipation, Zofran prn for nausea  PPx: Lovenox     LOS: 1 day   Lindie Spruceuningham, John, Medical Student 02/12/2018, 3:03 PM  Attestation for Student Documentation:  I personally was present and performed or re-performed the history, physical exam and medical decision-making activities of this service and have verified that the service and findings are accurately documented in the student's note.  Reymundo PollGuilloud, Isaiahs Chancy, MD 02/12/2018, 4:01 PM

## 2018-02-13 DIAGNOSIS — I10 Essential (primary) hypertension: Secondary | ICD-10-CM

## 2018-02-13 DIAGNOSIS — Z87310 Personal history of (healed) osteoporosis fracture: Secondary | ICD-10-CM

## 2018-02-13 DIAGNOSIS — Z79899 Other long term (current) drug therapy: Secondary | ICD-10-CM

## 2018-02-13 DIAGNOSIS — M80072A Age-related osteoporosis with current pathological fracture, left ankle and foot, initial encounter for fracture: Secondary | ICD-10-CM

## 2018-02-13 DIAGNOSIS — M80051A Age-related osteoporosis with current pathological fracture, right femur, initial encounter for fracture: Secondary | ICD-10-CM

## 2018-02-13 LAB — VITAMIN D 25 HYDROXY (VIT D DEFICIENCY, FRACTURES): Vit D, 25-Hydroxy: 44.7 ng/mL (ref 30.0–100.0)

## 2018-02-13 MED ORDER — SENNOSIDES-DOCUSATE SODIUM 8.6-50 MG PO TABS
1.0000 | ORAL_TABLET | Freq: Two times a day (BID) | ORAL | Status: DC
  Administered 2018-02-13 – 2018-02-14 (×2): 1 via ORAL
  Filled 2018-02-13 (×2): qty 1

## 2018-02-13 NOTE — Clinical Social Work Note (Signed)
Plan is for pt to [place] [facility] [place] tomorrow. Pt, pt's daughter, facility and Resident aware.    Velora MediateBridget Mariha Sleeper, MSW [phone]

## 2018-02-13 NOTE — Progress Notes (Addendum)
   Subjective:  Ms. Thad RangerMendiola states that her pain has been well controlled by the pain medicine, but that it is severe when the medication wears off or when she moves. She states that the pain medication makes her mouth dry and can make it difficult to talk. She denies chest pain, nausea or vomiting or SOB. She is amenable to short term rehab at a SNF.   Objective:  Vital signs in last 24 hours: Vitals:   02/12/18 0334 02/12/18 1335 02/12/18 2044 02/13/18 0554  BP: 133/70 137/60 (!) 120/59 (!) 105/56  Pulse: 70 77 72 69  Resp: 16 16 18 18   Temp: 98.8 F (37.1 C) 99.4 F (37.4 C) 98.3 F (36.8 C) (!) 97.4 F (36.3 C)  TempSrc: Oral Oral Oral Oral  SpO2: 92% 96% 100% 94%  Weight:      Height:       Weight change:   Intake/Output Summary (Last 24 hours) at 02/13/2018 1246 Last data filed at 02/13/2018 0900 Gross per 24 hour  Intake 720 ml  Output -  Net 720 ml   Physical Exam: General: No acute distress, pleasant  Pulm: No increased work of breathing, CAB CV: RRR, nMRG  Abd: BS+, soft, non-tender  Ext: warm and well perfused, posterior tibial pulses present bilaterally, no edema  Neuro: no focal defects   Assessment/Plan:  Active Problems:   Pelvic fracture (HCC)   Closed fracture of multiple pubic rami, right, initial encounter (HCC)   Nondisplaced fracture of fifth metatarsal bone, left foot, initial encounter for closed fracture  #Fracture of 5th Metatarsal and Right Pelvis  The patient is an otherwise healthy 77 year old female with her second fragility fracture. Pain is reasonably well controlled by Norco, and should continue to improve over time. PT/OT have recommended a SNF placement so the patient can access the rehab services she needs   -Pain control Norco 10-325 0.5-2 tablets q4 prn  - sennakot-s BID, miralax prn  -Zofran 4mg  q6 for nausea -PT/OT recomends SNF placement; social work consult for placement   #Osteoperosis with Repeat Fragility Fracture The  patient has a history of fracture of 4 vertebrae in 2013 and now presents with her second fragility fracture. She was previously on 5 years of alendronate therapy and is currently on her year off of this medication per her PCP. This should not represent loss of BMD post-bisphosphonate therapy given the short time off of this medication. It could therefore be seen as treatment failure with Bisphosphonate and possibly be an indication for an anabolic agent. Alternatively, it could represent the impetus to reinitiate alendronate, which has been shown to provide benefit with limited harm when used for 10 years. The patient has a good relationship with her PCP and the decision on this long-term medication management can be made as an outpatient.   #Hypertension: The patient has self reported history of hypertension. -Continue home Losartan-hydrochlorothiazide 20-25 mg    FEN/GI: Regular diet, senokot and miralax for constipation, Zofran prn for nausea  PPx: Lovenox     LOS: 2 days   Lindie Spruceuningham, John, Medical Student 02/13/2018, 12:46 PM   Attestation for Student Documentation:  I personally was present and performed or re-performed the history, physical exam and medical decision-making activities of this service and have verified that the service and findings are accurately documented in the student's note.  Nyra MarketSvalina, Golden Gilreath, MD 02/13/2018, 2:25 PM  [phone]

## 2018-02-13 NOTE — Plan of Care (Signed)
  Problem: Education: Goal: Knowledge of General Education information will improve Description Including pain rating scale, medication(s)/side effects and non-pharmacologic comfort measures Outcome: Progressing   Problem: Health Behavior/Discharge Planning: Goal: Ability to manage health-related needs will improve Outcome: Progressing   Problem: Clinical Measurements: Goal: Ability to maintain clinical measurements within normal limits will improve Outcome: Progressing Goal: Will remain free from infection Outcome: Progressing Goal: Diagnostic test results will improve Outcome: Progressing Goal: Respiratory complications will improve Outcome: Progressing Goal: Cardiovascular complication will be avoided Outcome: Progressing   Problem: Activity: Goal: Risk for activity intolerance will decrease Outcome: Progressing   Problem: Nutrition: Goal: Adequate nutrition will be maintained Outcome: Progressing   Problem: Coping: Goal: Level of anxiety will decrease Outcome: Progressing   Problem: Elimination: Goal: Will not experience complications related to bowel motility Outcome: Progressing Goal: Will not experience complications related to urinary retention Outcome: Progressing   Problem: Pain Managment: Goal: General experience of comfort will improve Outcome: Progressing   Problem: Safety: Goal: Ability to remain free from injury will improve Outcome: Progressing   Problem: Skin Integrity: Goal: Risk for impaired skin integrity will decrease Outcome: Progressing   Problem: Activity: Goal: Ability to ambulate and perform ADLs will improve Outcome: Progressing   Problem: Pain Management: Goal: Pain level will decrease Outcome: Progressing   

## 2018-02-13 NOTE — [place] FL2 (Signed)
MEDICAID FL2 LEVEL OF CARE SCREENING TOOL     IDENTIFICATION  Patient Name: Pamela ChessmanMaria Huffman Birthdate: 11/10/1940 Sex: female Admission Date (Current Location): 02/11/2018  [facility] [place]   Facility and Address:  [facility], [address], [place], [place] [address]      Provider Number: [id number]  Attending Physician Name and Address:  Burns SpainButcher, Elizabeth A, MD  Relative Name and Phone Number:       Current Level of Care: Hospital Recommended Level of Care: Skilled Nursing Facility Prior Approval Number:    Date Approved/Denied:   PASRR Number: [id number]  Discharge Plan: SNF    Current Diagnoses: Patient Active Problem List   Diagnosis Date Noted  . Closed fracture of multiple pubic rami, right, initial encounter (HCC)   . Nondisplaced fracture of fifth metatarsal bone, left foot, initial encounter for closed fracture   . Pelvic fracture (HCC) 02/11/2018    Orientation RESPIRATION BLADDER Height & Weight     Self, Time, Situation, Place  Normal Continent Weight: 162 lb 4.1 oz (73.6 kg) Height:  4\' 11"  (149.9 cm)  BEHAVIORAL SYMPTOMS/MOOD NEUROLOGICAL BOWEL NUTRITION STATUS      Continent Diet(Regular diet, thin liquids)  AMBULATORY STATUS COMMUNICATION OF NEEDS Skin   Limited Assist Verbally Normal                       Personal Care Assistance Level of Assistance  Bathing, Feeding, Dressing Bathing Assistance: Limited assistance Feeding assistance: Independent Dressing Assistance: Limited assistance     Functional Limitations Info  Sight, Hearing, Speech Sight Info: Adequate Hearing Info: Adequate Speech Info: Adequate    SPECIAL CARE FACTORS FREQUENCY  PT (By licensed PT), OT (By licensed OT)     PT Frequency: 3x OT Frequency: 3x            Contractures Contractures Info: Not present    Additional Factors Info  Code Status, Allergies Code Status Info: Full Code Allergies  Info: Codeine           Current Medications (02/13/2018):  This is the current hospital active medication list Current Facility-Administered Medications  Medication Dose Route Frequency Provider Last Rate Last Dose  . enoxaparin (LOVENOX) injection 40 mg  40 mg Subcutaneous Q24H Reymundo PollGuilloud, Carolyn, MD   40 mg at 02/12/18 1800  . lisinopril (PRINIVIL,ZESTRIL) tablet 20 mg  20 mg Oral Daily Burns SpainButcher, Elizabeth A, MD   20 mg at 02/13/18 0944   And  . hydrochlorothiazide (HYDRODIURIL) tablet 25 mg  25 mg Oral Daily Burns SpainButcher, Elizabeth A, MD   25 mg at 02/13/18 0944  . HYDROcodone-acetaminophen (NORCO) 10-325 MG per tablet 0.5-2 tablet  0.5-2 tablet Oral Q4H PRN Reymundo PollGuilloud, Carolyn, MD   1 tablet at 02/13/18 0247  . ondansetron (ZOFRAN) tablet 4 mg  4 mg Oral Q6H PRN Reymundo PollGuilloud, Carolyn, MD       Or  . ondansetron [facility](ZOFRAN) injection 4 mg  4 mg Intravenous Q6H PRN Reymundo PollGuilloud, Carolyn, MD   4 mg at 02/11/18 2026  . polyethylene glycol (MIRALAX / GLYCOLAX) packet 17 g  17 g Oral Daily PRN Reymundo PollGuilloud, Carolyn, MD   17 g at 02/13/18 0945  . senna-docusate (Senokot-S) tablet 1 tablet  1 tablet Oral BID Nyra MarketSvalina, Gorica, MD         Discharge Medications: Please see discharge summary for a list of discharge medications.  Relevant Imaging Results:  Relevant Lab Results:   Additional  Information SSN: [id number]  Maree Krabbe, LCSW

## 2018-02-13 NOTE — Progress Notes (Signed)
  Date: 02/13/2018  Patient name: Pamela ChessmanMaria Huffman  Medical record number: [id number]  Date of birth: 02/14/1941   I have seen and evaluated this patient and I have discussed the plan of care with the house staff. Please see their note for complete details. I concur with their findings with the following additions/corrections: Has bed at [facility] tomorrow. Will be stable for transfer.  Burns SpainButcher, Demitri Kucinski A, MD 02/13/2018, 4:58 PM

## 2018-02-13 NOTE — Clinical Social Work Note (Signed)
Clinical Social Work Assessment  Patient Details  Name: Pamela ChessmanMaria Coote MRN: [id number] Date of Birth: 03/30/1941  Date of referral:  02/13/18               Reason for consult:  Facility Placement                Permission sought to share information with:  Family Supports Permission granted to share information::  Yes, Verbal Permission Granted  Name::     Steward DroneBrenda  Agency::  [facility] facilities  Relationship::  daughter  Contact Information:  [phone]   Housing/Transportation Living arrangements for the past 2 months:  Single Family Home Source of Information:  Patient, Adult Children Patient Interpreter Needed:  None Criminal Activity/Legal Involvement Pertinent to Current Situation/Hospitalization:  No - Comment as needed Significant Relationships:  Adult Children Lives with:  Adult Children Do you feel safe going back to the place where you live?  No Need for family participation in patient care:  No (Coment)  Care giving concerns:  Pt is alert and oriented. Pt's daughter present at bedside. Pt lives with her daughter.   Social Worker assessment / plan:  CSW spoke with pt and pt's daughter at bedside. Pt is agreeable to SNF at d/c. Pt's daughter [facility]. If not able to get a bed at [facility] pt and pt's daughter would prefer [place] in [place]. CSW to follow up with bed availability once determined.  Employment status:  Retired Health and safety inspectornsurance information:  Medicare PT Recommendations:  Skilled Nursing Facility Information / Referral to community resources:  Skilled Nursing Facility  Patient/Family's Response to care:  Pt verbalized understanding of CSW role and expressed appreciation for support. Pt denies any concern regarding pt care at this time.  Patient/Family's Understanding of and Emotional Response to Diagnosis, Current Treatment, and Prognosis:  Pt understanding and realistic regarding physical limitations. Pt understands the  need for SNF placement at d/c. Pt agreeable to SNF placement at d/c, at this time. Pt's responses emotionally appropriate during conversation with CSW. Pt denies any concern regarding treatment plan at this time. CSW will continue to provide support and facilitate d/c needs.   Emotional Assessment Appearance:  Appears stated age Attitude/Demeanor/Rapport:  (Patient was appropriate) Affect (typically observed):  Accepting, Appropriate, Calm Orientation:  Oriented to Self, Oriented to Place, Oriented to  Time, Oriented to Situation Alcohol / Substance use:  Not Applicable Psych involvement (Current and /or in the community):  No (Comment)  Discharge Needs  Concerns to be addressed:  Basic Needs, Care Coordination Readmission within the last 30 days:    Current discharge risk:  Dependent with Mobility Barriers to Discharge:  Continued Medical Work up   Pacific MutualBridget A Emmy Keng, LCSW 02/13/2018, 2:00 PM

## 2018-02-13 NOTE — Progress Notes (Signed)
Physical Therapy Treatment Patient Details Name: Pamela Huffman MRN: [id number] DOB: 05/09/1941 Today's Date: 02/13/2018    History of Present Illness Pt is a 77 y.o. female presenting after a fall. Pt found to have nondisplaced fracture of the L fifth metatarsal base and R-sided pelvic pubic rami fractures. PMHx: HTN, Osteoporosis.    PT Comments    Pt very limited this session secondary to R hip and groin pain. Pt able to tolerate bed mobility with mod A, transfers with min A and very short distance ambulation (~5') with RW and min guard. Pt would continue to benefit from skilled physical therapy services at this time while admitted and after d/c to address the below listed limitations in order to improve overall safety and independence with functional mobility.    Follow Up Recommendations  SNF     Equipment Recommendations  None recommended by PT    Recommendations for Other Services       Precautions / Restrictions Precautions Precautions: Fall Precaution Comments: Post op shoe L foot--pt reports it makes her pain worse Restrictions Weight Bearing Restrictions: Yes RLE Weight Bearing: Weight bearing as tolerated LLE Weight Bearing: Weight bearing as tolerated    Mobility  Bed Mobility Overal bed mobility: Needs Assistance Bed Mobility: Supine to Sit     Supine to sit: Mod assist     General bed mobility comments: increased time and effort, min A for bilateral LE movement off of bed and mod A for trunk elevation to achieve sitting EOB  Transfers Overall transfer level: Needs assistance Equipment used: Rolling walker (2 wheeled) Transfers: Sit to/from Stand Sit to Stand: Min assist         General transfer comment: increased time and effort, cueing for safe hand placement, min A to power into standing and for stability with transition  Ambulation/Gait Ambulation/Gait assistance: Min guard Gait Distance (Feet): 5 Feet Assistive device: Rolling walker (2  wheeled) Gait Pattern/deviations: Step-to pattern;Decreased step length - right;Decreased step length - left;Decreased stride length;Decreased stance time - right;Decreased weight shift to right;Antalgic Gait velocity: decreased Gait velocity interpretation: <1.31 ft/sec, indicative of household ambulator General Gait Details: pt with difficulty advancing L LE forward and required cueing and min A for weight shift towards R to allow for forward advancement of L LE; heavy reliance on RW   Stairs             Wheelchair Mobility    Modified Rankin (Stroke Patients Only)       Balance Overall balance assessment: Needs assistance;History of Falls Sitting-balance support: Feet supported;No upper extremity supported Sitting balance-Leahy Scale: Good     Standing balance support: Bilateral upper extremity supported Standing balance-Leahy Scale: Poor                              Cognition Arousal/Alertness: Awake/alert Behavior During Therapy: WFL for tasks assessed/performed Overall Cognitive Status: Within Functional Limits for tasks assessed                                        Exercises General Exercises - Lower Extremity Ankle Circles/Pumps: AROM;Both;20 reps;Seated    General Comments        Pertinent Vitals/Pain Pain Assessment: 0-10 Pain Score: 10-Worst pain ever Pain Location: R hip and groin Pain Descriptors / Indicators: Grimacing;Guarding;Aching Pain Intervention(s): Monitored during session;Repositioned  Home Living                      Prior Function            PT Goals (current goals can now be found in the care plan section) Acute Rehab PT Goals PT Goal Formulation: With patient/family Time For Goal Achievement: 02/26/18 Potential to Achieve Goals: Good Progress towards PT goals: Progressing toward goals    Frequency    Min 3X/week      PT Plan Current plan remains appropriate     Co-evaluation              AM-PAC PT "6 Clicks" Daily Activity  Outcome Measure  Difficulty turning over in bed (including adjusting bedclothes, sheets and blankets)?: Unable Difficulty moving from lying on back to sitting on the side of the bed? : Unable Difficulty sitting down on and standing up from a chair with arms (e.g., wheelchair, bedside commode, etc,.)?: Unable Help needed moving to and from a bed to chair (including a wheelchair)?: A Little Help needed walking in hospital room?: A Little Help needed climbing 3-5 steps with a railing? : A Lot 6 Click Score: 11    End of Session Equipment Utilized During Treatment: Gait belt Activity Tolerance: Patient limited by pain Patient left: in chair;with call bell/phone within reach;with family/visitor present Nurse Communication: Mobility status PT Visit Diagnosis: Other abnormalities of gait and mobility (R26.89);Pain Pain - Right/Left: Right Pain - part of body: Hip     Time: 8469-6295 PT Time Calculation (min) (ACUTE ONLY): 13 min  Charges:  $Therapeutic Activity: 8-22 mins                     [place], [place], [place] [phone]    Alessandra Bevels Treyshaun Keatts 02/13/2018, 3:50 PM

## 2018-02-13 NOTE — Discharge Summary (Addendum)
Name: Pamela Huffman MRN: [id number] DOB: 28-Nov-1940 77 y.o. PCP: No primary care provider on file.  Date of Admission: 02/11/2018 12:02 PM Date of Discharge: 02/14/2018 Attending Physician: Dr. Earl Lagos Discharge Diagnosis: 1. Fragility fracture - left 5th metatarsal, right pelvic ramus 2. Osteoprosis  Discharge Medications: Allergies as of 02/14/2018      Reactions   Codeine Nausea Only      Medication List    TAKE these medications   HYDROcodone-acetaminophen 10-325 MG tablet Commonly known as:  NORCO Take 0.5-1 tablets by mouth every 4 (four) hours as needed (1/2 tablet for mild pain, 1 tablet for moderate pain).   lisinopril-hydrochlorothiazide 20-25 MG tablet Commonly known as:  PRINZIDE,ZESTORETIC Take 1 tablet by mouth daily.   magnesium citrate Soln Take 148 mLs (0.5 Bottles total) by mouth daily as needed for severe constipation.   polyethylene glycol packet Commonly known as:  MIRALAX / GLYCOLAX Take 17 g by mouth daily as needed for mild constipation.   senna 8.6 MG Tabs tablet Commonly known as:  SENOKOT Take 2 tablets (17.2 mg total) by mouth 2 (two) times daily.       Disposition and follow-up:   Ms.Sanela Strohecker was discharged from [facility] in Stable condition.  At the hospital follow up visit please address:  1.   Fragility fracture: --SNF stay for rehabilitation --Follow up with Dr. Magnus Ivan from Orthopaedics at discharg  Osteoporosis: --f/u with PCP to discuss restarting bisphosphonate vs starting anabolic agent   2.  Labs / imaging needed at time of follow-up: n/a  3.  Pending labs/ test needing follow-up: n/a  Follow-up Appointments:  Contact information for follow-up providers    Kathryne Hitch, MD. Schedule an appointment as soon as possible for a visit in 2 week(s).   Specialty:  Orthopedic Surgery Contact information: [address] [place] [address] [phone]            Contact information for after-discharge care    Destination    [facility] .   Service:  Skilled Nursing Contact information: [address] [place] [address] [phone]                  Hospital Course by problem list: Ms. Pamela Huffman is a 77 year old female with a history of a fragility fracture of 4 vertebrae while carrying a box in 2013 and hypertension presented with pain in her hip and left foot after a mechanical fall.    1. Fractured left 5th metatarsal and right sided pelvic pubic rami:  The patient presented after a mechanical fall where she fell on her right hip on a curve. The history did not suggest syncope or non-mechanical etiology for the fall. The patient was evaluated in the ED by Orthopedics and an xray of the patient's left foot revealed an acute nondisplaced fracture involving the base of the fifth metatarsal and xray of the right hip and pelvis revealed an acute fracture involving the right pubic bone with likely involvement of the medial aspect of the superior and inferior pubic rami. This was determined to not need surgical fixation or stabilization and orthopaedics signed off; she will follow up with Dr. Magnus Ivan outpatient. The patient had her pain controlled on oral Norco 5-10 mg and was able to participate in sessions with PT and OT. These therapy services recommended SNF as the most appropriate short-term location for the patient upon discharge.     2. Osteoperosis:  After fracturing 4 vertebrae in 2013 carrying boxes, the patient was initiated on allendronate for 5 years. This year, as instructed by her PCP, she had stopped taking this medication. Given the patient's second fragility fracture, it could be beneficial for her to be reinitiated on allendronate for a 10 year duration of therapy or on an anabolic agent to support BMD and help prevent future fractures. The patient seems to have a good relationship with her  PCP and we will leave the initiation of this therapy to be decided upon when the patient leaves the hospital.   3. Hypertension: The patient was continued on her home dose of lisinopril and hydrochlorothiazide 20-25 during admission. Her BP remained stable and well controlled.    Discharge Vitals:   BP (!) 102/57 (BP Location: Right Arm)   Pulse 63   Temp 97.8 F (36.6 C) (Oral)   Resp 14   Ht 4\' 11"  (1.499 m)   Wt 162 lb 4.1 oz (73.6 kg)   SpO2 95%   BMI 32.77 kg/m   Pertinent Labs, Studies, and Procedures:   02/11/18 DG Left Foot  Acute nondisplaced fracture involving the base of the fifth metatarsal.  02/11/18 DG Hip unilateral  Acute fracture involving the RIGHT pubic bone with likely involvement of the MEDIAL aspect of the SUPERIOR and INFERIOR pubic rami.  Vitamin D 25-hydroxy 02/12/2018: 44.7 (ref 20-100)  CBC Latest Ref Rng & Units 02/12/2018 02/11/2018 02/11/2018  WBC 4.0 - 10.5 K/uL 8.2 - 12.6(H)  Hemoglobin 12.0 - 15.0 g/dL 16.1 09.6 04.5  Hematocrit 36.0 - 46.0 % 37.7 41.0 41.3  Platelets 150 - 400 K/uL 197 - 234   BMET    Component Value Date/Time   NA 135 02/12/2018 0350   K 4.4 02/12/2018 0350   CL 98 02/12/2018 0350   CO2 28 02/12/2018 0350   GLUCOSE 118 (H) 02/12/2018 0350   BUN 15 02/12/2018 0350   CREATININE 0.81 02/12/2018 0350   CALCIUM 9.0 02/12/2018 0350   GFRNONAA >60 02/12/2018 0350   GFRAA >60 02/12/2018 0350    Discharge Instructions: Discharge Instructions    Call MD for:  difficulty breathing, headache or visual disturbances   Complete by:  As directed    Call MD for:  extreme fatigue   Complete by:  As directed    Call MD for:  hives   Complete by:  As directed    Call MD for:  persistant dizziness or light-headedness   Complete by:  As directed    Call MD for:  persistant nausea and vomiting   Complete by:  As directed    Call MD for:  redness, tenderness, or signs of infection (pain, swelling, redness, odor or green/yellow  discharge around incision site)   Complete by:  As directed    Call MD for:  severe uncontrolled pain   Complete by:  As directed    Call MD for:  temperature >100.4   Complete by:  As directed    Diet - low sodium heart healthy   Complete by:  As directed    Discharge instructions   Complete by:  As directed    For pain: Take 0.5 to 1 tab of hydrocodone-apap every 4-6 hours.   For constipation:  Take 2 tabs of senna twice daily, as needed daily miralax, and as needed daily magnesium citrate (half a bottle).   Please discuss either restarting fosamax for your osteoporosis or a new medicine with your primary care doctor at  your follow up appointment.   Increase activity slowly   Complete by:  As directed       Signed: Nyra MarketSvalina, Olisa Quesnel, MD 02/14/2018, 11:39 AM

## 2018-02-14 MED ORDER — MAGNESIUM CITRATE PO SOLN: 0.5000 | Freq: Every day | ORAL | 0 refills | Status: DC | PRN

## 2018-02-14 MED ORDER — SENNA 8.6 MG PO TABS: 2.0000 | ORAL_TABLET | Freq: Two times a day (BID) | ORAL | 0 refills | Status: DC

## 2018-02-14 MED ORDER — POLYETHYLENE GLYCOL 3350 17 G PO PACK: 17.0000 g | PACK | Freq: Every day | ORAL | 0 refills | Status: DC | PRN

## 2018-02-14 MED ORDER — MAGNESIUM CITRATE PO SOLN
0.5000 | Freq: Once | ORAL | Status: AC
  Administered 2018-02-14: 0.5 via ORAL
  Filled 2018-02-14: qty 296

## 2018-02-14 MED ORDER — HYDROCODONE-ACETAMINOPHEN 10-325 MG PO TABS: 0.5000 | ORAL_TABLET | ORAL | 0 refills | Status: DC | PRN

## 2018-02-14 NOTE — Progress Notes (Signed)
Patient states her primary care doctor is Dr. Barbette HairSisdasis in [place].

## 2018-02-14 NOTE — Progress Notes (Signed)
Report called to [facility] at this time.

## 2018-02-14 NOTE — Plan of Care (Signed)
  Problem: Education: Goal: Knowledge of General Education information will improve Description Including pain rating scale, medication(s)/side effects and non-pharmacologic comfort measures Outcome: Progressing   Problem: Clinical Measurements: Goal: Ability to maintain clinical measurements within normal limits will improve Outcome: Progressing Goal: Will remain free from infection Outcome: Progressing Goal: Diagnostic test results will improve Outcome: Progressing Goal: Respiratory complications will improve Outcome: Progressing Goal: Cardiovascular complication will be avoided Outcome: Progressing   Problem: Activity: Goal: Risk for activity intolerance will decrease Outcome: Progressing   Problem: Elimination: Goal: Will not experience complications related to bowel motility Outcome: Progressing Goal: Will not experience complications related to urinary retention Outcome: Progressing   Problem: Pain Managment: Goal: General experience of comfort will improve Outcome: Progressing   

## 2018-02-14 NOTE — Progress Notes (Signed)
Internal Medicine Attending:   I saw and examined the patient. I reviewed the resident's note and I agree with the resident's findings and plan as documented in the resident's note.  Patient feels well today but complains of constipation.  She was initially admitted for fragility fracture of the left fifth metatarsal and right pubic rami.  Ortho evaluated the patient.  No surgical intervention at this time.  PT/OT recommend SNF for rehab.  We will continue with pain control for now.  Patient is stable for discharge to SNF today.  No further work-up at this time.

## 2018-02-14 NOTE — Progress Notes (Signed)
   Subjective:  Patient states she is doing well today; her main concern is constipation. She denies significant abdominal discomfort, nausea, vomiting.   Objective:  Vital signs in last 24 hours: Vitals:   02/12/18 2044 02/13/18 0554 02/13/18 2055 02/14/18 0508  BP: (!) 120/59 (!) 105/56 (!) 122/52 (!) 102/57  Pulse: 72 69 74 63  Resp: 18 18 16 14   Temp: 98.3 F (36.8 C) (!) 97.4 F (36.3 C) 98.3 F (36.8 C) 97.8 F (36.6 C)  TempSrc: Oral Oral Oral Oral  SpO2: 100% 94% 96% 95%  Weight:      Height:       Constitutional: NAD CV: RRR Resp: CTAB, no increased work of breathing Abd: soft, NDNT, +BS Ext: warm, moves all 4 freely  Assessment/Plan:  Active Problems:   Pelvic fracture (HCC)   Closed fracture of multiple pubic rami, right, initial encounter (HCC)   Nondisplaced fracture of fifth metatarsal bone, left foot, initial encounter for closed fracture  Fragility fracture of left 5th metatarsal and right pubic rami: Patient evaluated by ortho on admission; no surgery indicated at this time. PT/OT recommend SNF for rehab with which the patient is in agreement. Pain is adequately controlled, however patient is hesitant about taking pain medicines as she is constipated and still hasn't had a BM since admission. --DC to SNF today --Senna BID, Miralax daily, one time dose of half bottle mag citrate --norco 1/2 to 1 pill (10-325mg ) q6hr for pain --f/u with Dr. Magnus IvanBlackman outpt in ~2 wks  Osteoporosis: Patient already completed 5 yr course of bisphosphates earlier this year prior to these fragility fractures. She is a candidate for either 5 additional years vs starting anabolic agent. Patient to discuss with PCP on best choice for her.   HTN: Stable.  --losartan-HCTZ 20-25mg  daily  Dispo: Anticipated discharge today to SNF.   Nyra MarketSvalina, Shene Maxfield, MD 02/14/2018, 11:05 AM IMTS - PGY3 Pager [phone]

## 2018-02-14 NOTE — Clinical Social Work Placement (Signed)
   CLINICAL SOCIAL WORK PLACEMENT  NOTE  Date:  02/14/2018  Patient Details  Name: Pamela Huffman MRN: [id number] Date of Birth: 10/11/1940  Clinical Social Work is seeking post-discharge placement for this patient at the Skilled  Nursing Facility level of care (*CSW will initial, date and re-position this form in  chart as items are completed):      Patient/family provided with [facility] Clinical Social Work Department's list of facilities offering this level of care within the geographic area requested by the patient (or if unable, by the patient's family).  Yes   Patient/family informed of their freedom to choose among providers that offer the needed level of care, that participate in Medicare, Medicaid or managed care program needed by the patient, have an available bed and are willing to accept the patient.      Patient/family informed of [facility]'s ownership interest in [facility] and [facility], as well as of the fact that they are under no obligation to receive care at these facilities.  PASRR submitted to EDS on       PASRR number received on 02/13/18     Existing PASRR number confirmed on       FL2 transmitted to all facilities in geographic area requested by pt/family on 02/13/18     FL2 transmitted to all facilities within larger geographic area on       Patient informed that his/her managed care company has contracts with or will negotiate with certain facilities, including the following:        Yes   Patient/family informed of bed offers received.  Patient chooses bed at       Physician recommends and patient chooses bed at      Patient to be transferred to   on 02/14/18.  Patient to be transferred to facility by [facility]     Patient family notified on 02/14/18 of transfer.  Name of family member notified:  Steward DroneBrenda     PHYSICIAN       Additional Comment:    _______________________________________________ Maree KrabbeBridget A Apple Dearmas, LCSW 02/14/2018, 11:57  AM

## 2018-02-14 NOTE — Clinical Social Work Note (Signed)
Clinical Social Worker facilitated patient discharge including contacting patient family and facility to confirm patient discharge plans.  Clinical information faxed to facility and family agreeable with plan.  CSW arranged ambulance transport via [facility] to [facility] in [place].  RN to call [phone] for report prior to discharge.  Clinical Social Worker will sign off for now as social work intervention is no longer needed. Please consult us again if new need arises.  Velora MediateBridget Daivon Rayos, MSW [phone]

## 2018-02-14 NOTE — Clinical Social Work Placement (Signed)
   CLINICAL SOCIAL WORK PLACEMENT  NOTE  Date:  02/14/2018  Patient Details  Name: Pamela Huffman MRN: [id number] Date of Birth: 01/12/1941  Clinical Social Work is seeking post-discharge placement for this patient at the Skilled  Nursing Facility level of care (*CSW will initial, date and re-position this form in  chart as items are completed):      Patient/family provided with [facility] Clinical Social Work Department's list of facilities offering this level of care within the geographic area requested by the patient (or if unable, by the patient's family).  Yes   Patient/family informed of their freedom to choose among providers that offer the needed level of care, that participate in Medicare, Medicaid or managed care program needed by the patient, have an available bed and are willing to accept the patient.      Patient/family informed of [facility]'s ownership interest in [facility] and [facility], as well as of the fact that they are under no obligation to receive care at these facilities.  PASRR submitted to EDS on       PASRR number received on 02/13/18     Existing PASRR number confirmed on       FL2 transmitted to all facilities in geographic area requested by pt/family on 02/13/18     FL2 transmitted to all facilities within larger geographic area on       Patient informed that his/her managed care company has contracts with or will negotiate with certain facilities, including the following:        Yes   Patient/family informed of bed offers received.  Patient chooses bed at       Physician recommends and patient chooses bed at      Patient to be transferred to   on 02/14/18.  Patient to be transferred to facility by [facility]     Patient family notified on 02/14/18 of transfer.  Name of family member notified:  Steward DroneBrenda     PHYSICIAN       Additional Comment:    _______________________________________________ Maree KrabbeBridget A Kekai Geter, LCSW 02/14/2018, 11:48  AM

## 2018-02-14 NOTE — Plan of Care (Signed)
  Problem: Education: Goal: Knowledge of General Education information will improve Description Including pain rating scale, medication(s)/side effects and non-pharmacologic comfort measures 02/14/2018 1202 by Darreld Mcleanox, Skyler Dusing, RN Outcome: Adequate for Discharge 02/14/2018 0906 by Darreld Mcleanox, Epimenio Schetter, RN Outcome: Progressing   Problem: Health Behavior/Discharge Planning: Goal: Ability to manage health-related needs will improve 02/14/2018 1202 by Darreld Mcleanox, Evi Mccomb, RN Outcome: Adequate for Discharge 02/14/2018 0906 by Darreld Mcleanox, Gerritt Galentine, RN Outcome: Progressing   Problem: Clinical Measurements: Goal: Ability to maintain clinical measurements within normal limits will improve 02/14/2018 1202 by Darreld Mcleanox, Montee Tallman, RN Outcome: Adequate for Discharge 02/14/2018 0906 by Darreld Mcleanox, Aren Cherne, RN Outcome: Progressing Goal: Will remain free from infection 02/14/2018 1202 by Darreld Mcleanox, Jaskirat Zertuche, RN Outcome: Adequate for Discharge 02/14/2018 0906 by Darreld Mcleanox, [place] Warriner, RN Outcome: Progressing Goal: Diagnostic test results will improve 02/14/2018 1202 by Darreld Mcleanox, Jordany Russett, RN Outcome: Adequate for Discharge 02/14/2018 0906 by Darreld Mcleanox, Elizbeth Posa, RN Outcome: Progressing Goal: Respiratory complications will improve 02/14/2018 1202 by Darreld Mcleanox, Sergey Ishler, RN Outcome: Adequate for Discharge 02/14/2018 0906 by Darreld Mcleanox, Keenya Matera, RN Outcome: Progressing Goal: Cardiovascular complication will be avoided 02/14/2018 1202 by Darreld Mcleanox, Estera Ozier, RN Outcome: Adequate for Discharge 02/14/2018 0906 by Darreld Mcleanox, Morris Longenecker, RN Outcome: Progressing   Problem: Activity: Goal: Risk for activity intolerance will decrease 02/14/2018 1202 by Darreld Mcleanox, Jeramy Dimmick, RN Outcome: Adequate for Discharge 02/14/2018 0906 by Darreld Mcleanox, Jadore Veals, RN Outcome: Progressing   Problem: Nutrition: Goal: Adequate nutrition will be maintained 02/14/2018 1202 by Darreld Mcleanox, Wilbert Hayashi, RN Outcome: Adequate for Discharge 02/14/2018 0906 by Darreld Mcleanox, Nikodem Leadbetter, RN Outcome: Progressing   Problem: Coping: Goal: Level of anxiety will decrease 02/14/2018 1202 by Darreld Mcleanox, Buna Cuppett, RN Outcome: Adequate for  Discharge 02/14/2018 0906 by Darreld Mcleanox, Anddy Wingert, RN Outcome: Progressing   Problem: Elimination: Goal: Will not experience complications related to bowel motility 02/14/2018 1202 by Darreld Mcleanox, Dezire Turk, RN Outcome: Adequate for Discharge 02/14/2018 0906 by Darreld Mcleanox, Adi Seales, RN Outcome: Progressing Goal: Will not experience complications related to urinary retention 02/14/2018 1202 by Darreld Mcleanox, Rashad Auld, RN Outcome: Adequate for Discharge 02/14/2018 0906 by Darreld Mcleanox, Melchizedek Espinola, RN Outcome: Progressing   Problem: Pain Managment: Goal: General experience of comfort will improve 02/14/2018 1202 by Darreld Mcleanox, Ailah Barna, RN Outcome: Adequate for Discharge 02/14/2018 0906 by Darreld Mcleanox, Audree Schrecengost, RN Outcome: Progressing   Problem: Safety: Goal: Ability to remain free from injury will improve 02/14/2018 1202 by Darreld Mcleanox, Trevonte Ashkar, RN Outcome: Adequate for Discharge 02/14/2018 0906 by Darreld Mcleanox, Reyes Aldaco, RN Outcome: Progressing   Problem: Skin Integrity: Goal: Risk for impaired skin integrity will decrease 02/14/2018 1202 by Darreld Mcleanox, Dearis Danis, RN Outcome: Adequate for Discharge 02/14/2018 0906 by Darreld Mcleanox, Jadan Rouillard, RN Outcome: Progressing   Problem: Activity: Goal: Ability to ambulate and perform ADLs will improve 02/14/2018 1202 by Darreld Mcleanox, Denny Mccree, RN Outcome: Adequate for Discharge 02/14/2018 0906 by Darreld Mcleanox, Daira Hine, RN Outcome: Progressing   Problem: Pain Management: Goal: Pain level will decrease 02/14/2018 1202 by Darreld Mcleanox, Teneil Shiller, RN Outcome: Adequate for Discharge 02/14/2018 0906 by Darreld Mcleanox, Betrice Wanat, RN Outcome: Progressing

## 2018-02-25 ENCOUNTER — Inpatient Hospital Stay (INDEPENDENT_AMBULATORY_CARE_PROVIDER_SITE_OTHER): Payer: Medicare Other | Admitting: Physician Assistant

## 2018-03-05 ENCOUNTER — Ambulatory Visit (INDEPENDENT_AMBULATORY_CARE_PROVIDER_SITE_OTHER): Payer: No Typology Code available for payment source | Admitting: Physician Assistant

## 2018-03-05 ENCOUNTER — Encounter (INDEPENDENT_AMBULATORY_CARE_PROVIDER_SITE_OTHER): Payer: Self-pay | Admitting: Physician Assistant

## 2018-03-05 ENCOUNTER — Ambulatory Visit (INDEPENDENT_AMBULATORY_CARE_PROVIDER_SITE_OTHER): Payer: Self-pay

## 2018-03-05 ENCOUNTER — Inpatient Hospital Stay (INDEPENDENT_AMBULATORY_CARE_PROVIDER_SITE_OTHER): Payer: Medicare Other | Admitting: Physician Assistant

## 2018-03-05 DIAGNOSIS — S92355A Nondisplaced fracture of fifth metatarsal bone, left foot, initial encounter for closed fracture: Secondary | ICD-10-CM | POA: Diagnosis not present

## 2018-03-05 DIAGNOSIS — S32591A Other specified fracture of right pubis, initial encounter for closed fracture: Secondary | ICD-10-CM | POA: Diagnosis not present

## 2018-03-05 NOTE — Progress Notes (Signed)
Office Visit Note   Patient: Pamela ChessmanMaria Huffman           Date of Birth: 07/26/1940           MRN: [id number] Visit Date: 03/05/2018              Requested by: Shelle IronSistasis, Jim, MD [address] [place], [place] [id number] PCP: Shelle IronSistasis, Jim, MD   Assessment & Plan: Visit Diagnoses:  1. Closed fracture of multiple pubic rami, right, initial encounter (HCC)   2. Nondisplaced fracture of fifth metatarsal bone, left foot, initial encounter for closed fracture     Plan: She is weightbearing as tolerated with left foot and right lower extremity.  Continue the postop shoe on the left foot for at least 2 weeks.  Continue to work with physical therapy for strengthening range of motion gait and balance.  We will see her back in 1 month at that time obtain an AP pelvis and 3 views of the left foot.  Questions encouraged and answered with patient and her daughters present today.  Follow-Up Instructions: Return in about 4 weeks (around 04/02/2018) for Radiographs.   Orders:  Orders Placed This Encounter  Procedures  . XR Foot Complete Left  . XR Pelvis 1-2 Views   No orders of the defined types were placed in this encounter.     Procedures: No procedures performed   Clinical Data: No additional findings.   Subjective: Chief Complaint  Patient presents with  . Left Foot - Injury  . pelvic fx    HPI Patient is a [age] year old female who tripped over a curb on 02/11/2018.  She is seen in the ER she was found to have a nondisplaced fifth left metatarsal base fracture.  Also found to have a right inferior rami fracture.  She comes in today for follow-up.  She denies any loss of consciousness at the time of injury.  She states overall that her pelvic fractures are getting less painful.  She has been able to bear weight in a postop shoe on the left foot. Review of Systems See HPI otherwise negative  Objective: Vital Signs: There were no vitals taken for this visit.  Physical Exam    Constitutional: She is oriented to person, place, and time. She appears well-developed and well-nourished. No distress.  Pulmonary/Chest: Effort normal.  Neurological: She is alert and oriented to person, place, and time.  Skin: She is not diaphoretic.    Ortho Exam Bilateral calf supple nontender.  She has good range of motion of the right hip without significant pain.  Left foot full dorsiflexion plantarflexion ankle.  Tenderness at the base of the fifth metatarsal.  Remainder the foot is nontender.  Ecchymosis involving the lesser toes of the left foot. Specialty Comments:  No specialty comments available.  Imaging: Xr Foot Complete Left  Result Date: 03/05/2018 Left foot 3 views: Base of fifth metatarsal fracture remains nondisplaced.  No other fractures identified.  Early consolidation of the fractures evident.  Xr Pelvis 1-2 Views  Result Date: 03/05/2018 AP pelvis: Comminuted fracture involving the inferior pubic rami and medial aspect of the pubic rami.  Bilateral hips well located.  No other bony abnormalities.    PMFS History: Patient Active Problem List   Diagnosis Date Noted  . Closed fracture of multiple pubic rami, right, initial encounter (HCC)   . Nondisplaced fracture of fifth metatarsal bone, left foot, initial encounter for closed fracture   . Pelvic fracture (HCC) 02/11/2018  Past Medical History:  Diagnosis Date  . Hypertension   . Osteoporosis     History reviewed. No pertinent family history.  Past Surgical History:  Procedure Laterality Date  . DILATION AND CURETTAGE OF UTERUS    . TONSILLECTOMY     Social History   Occupational History  . Not on file  Tobacco Use  . Smoking status: Former Smoker    Types: Cigarettes    Last attempt to quit: 07/22/1988    Years since quitting: 29.6  . Smokeless tobacco: Never Used  Substance and Sexual Activity  . Alcohol use: Not Currently  . Drug use: Never  . Sexual activity: Not on file

## 2018-03-12 ENCOUNTER — Telehealth (INDEPENDENT_AMBULATORY_CARE_PROVIDER_SITE_OTHER): Payer: Self-pay | Admitting: Orthopaedic Surgery

## 2018-03-12 NOTE — Telephone Encounter (Signed)
Verbal order given  

## 2018-03-12 NOTE — Telephone Encounter (Signed)
Lyla Sonarrie[facility] to see patient   1 x a week for 3 weeks   to manage medications.  CB # [phone]

## 2018-03-16 ENCOUNTER — Telehealth (INDEPENDENT_AMBULATORY_CARE_PROVIDER_SITE_OTHER): Payer: Self-pay

## 2018-03-16 NOTE — Telephone Encounter (Signed)
Renita PapaFYI  Alan physical [facility] Left voicemail and would like Verbal orders for patient to continue rehab for hip for 2 times a wk for 2 weeks and then 1 time a wk for 1 wk.  Called him back to approve orders.   CB [phone]

## 2018-03-16 NOTE — Telephone Encounter (Signed)
Noted  

## 2018-03-17 ENCOUNTER — Telehealth (INDEPENDENT_AMBULATORY_CARE_PROVIDER_SITE_OTHER): Payer: Self-pay | Admitting: Orthopaedic Surgery

## 2018-03-17 NOTE — Telephone Encounter (Signed)
Verbal order left on VM  

## 2018-03-17 NOTE — Telephone Encounter (Signed)
Kathlene NovemberMike, [facility] eval yesterday with patient and he is requesting 1 additional visit to set up medical equipment for safety purposes. Mikes # [phone]

## 2018-04-02 ENCOUNTER — Ambulatory Visit (INDEPENDENT_AMBULATORY_CARE_PROVIDER_SITE_OTHER): Payer: Medicare Other | Admitting: Physician Assistant

## 2018-04-02 ENCOUNTER — Encounter (INDEPENDENT_AMBULATORY_CARE_PROVIDER_SITE_OTHER): Payer: Self-pay | Admitting: Physician Assistant

## 2018-04-02 ENCOUNTER — Ambulatory Visit (INDEPENDENT_AMBULATORY_CARE_PROVIDER_SITE_OTHER): Payer: Medicare Other

## 2018-04-02 DIAGNOSIS — S92355A Nondisplaced fracture of fifth metatarsal bone, left foot, initial encounter for closed fracture: Secondary | ICD-10-CM

## 2018-04-02 DIAGNOSIS — S32591A Other specified fracture of right pubis, initial encounter for closed fracture: Secondary | ICD-10-CM

## 2018-04-02 NOTE — Progress Notes (Signed)
Office Visit Note   Patient: Pamela Huffman           Date of Birth: 1940/12/16           MRN: [id number] Visit Date: 04/02/2018              Requested by: Pamela Iron, MD [address] [place], [place] [address] PCP: Pamela Iron, MD   Assessment & Plan: Visit Diagnoses:  1. Closed fracture of multiple pubic rami, right, initial encounter (HCC)   2. Nondisplaced fracture of fifth metatarsal bone, left foot, initial encounter for closed fracture     Plan: She will continue to work on strengthening gait and balance.  She is in a regular shoe on the left foot as tolerated.  See her back in 5 weeks that time obtain 3 views of the right foot and AP pelvis.  Questions are encouraged and answered.  If she fails to heal the fifth metatarsal fracture she may benefit from a bone stimulator.  Follow-Up Instructions: Return in about 5 weeks (around 05/07/2018).   Orders:  Orders Placed This Encounter  Procedures  . XR Pelvis 1-2 Views  . XR Foot Complete Left   No orders of the defined types were placed in this encounter.     Procedures: No procedures performed   Clinical Data: No additional findings.   Subjective: Chief Complaint  Patient presents with  . Left Foot - Follow-up  . Pelvis - Follow-up    HPI Pamela Huffman returns today 7 weeks status post injury in which she sustained a nondisplaced fracture of the fifth metatarsal base and right rami fractures.  She states overall that she is trending towards improvement she is ambulating with a cane is done some for the last 2 weeks.  She has transition from postop shoe to a regular shoe at this point time.  States she has no real pain in the foot.  She does feel that her gait and balance are somewhat off and is been working with therapy on this at home. Review of Systems No fevers chills shortness of breath chest pain  Objective: Vital Signs: There were no vitals taken for this visit.  Physical Exam General:  Well-developed well-nourished female no acute distress made affect appropriate Ortho Exam Right hip good range of motion without pain.  Ambulates with a slight antalgic gait is able to ambulate on her own around the room without a walker or cane.  Left foot she has tenderness over the dorsal aspect of the fifth metatarsal base.  No tenderness over the lateral aspect of the fifth metatarsal base.  She is able to evert her foot.  Left calf supple nontender. Specialty Comments:  No specialty comments available.  Imaging: Xr Foot Complete Left  Result Date: 04/02/2018 Left foot 3 views: Base of fifth metatarsal fracture remains nondisplaced.  Some minimal interval healing.  No other fractures identified.  Xr Pelvis 1-2 Views  Result Date: 04/02/2018 AP pelvis: Interval healing of the rami fractures on the right.  Bilateral hips well located.  Fractures remain nondisplaced.    PMFS History: Patient Active Problem List   Diagnosis Date Noted  . Closed fracture of multiple pubic rami, right, initial encounter (HCC)   . Nondisplaced fracture of fifth metatarsal bone, left foot, initial encounter for closed fracture   . Pelvic fracture (HCC) 02/11/2018   Past Medical History:  Diagnosis Date  . Hypertension   . Osteoporosis     No family history on file.  Past Surgical History:  Procedure Laterality Date  . DILATION AND CURETTAGE OF UTERUS    . TONSILLECTOMY     Social History   Occupational History  . Not on file  Tobacco Use  . Smoking status: Former Smoker    Types: Cigarettes    Last attempt to quit: 07/22/1988    Years since quitting: 29.7  . Smokeless tobacco: Never Used  Substance and Sexual Activity  . Alcohol use: Not Currently  . Drug use: Never  . Sexual activity: Not on file

## 2018-05-07 ENCOUNTER — Ambulatory Visit (INDEPENDENT_AMBULATORY_CARE_PROVIDER_SITE_OTHER): Payer: Medicare Other | Admitting: Physician Assistant

## 2018-05-07 ENCOUNTER — Ambulatory Visit (INDEPENDENT_AMBULATORY_CARE_PROVIDER_SITE_OTHER): Payer: Medicare Other

## 2018-05-07 ENCOUNTER — Encounter (INDEPENDENT_AMBULATORY_CARE_PROVIDER_SITE_OTHER): Payer: Self-pay | Admitting: Physician Assistant

## 2018-05-07 DIAGNOSIS — M79672 Pain in left foot: Secondary | ICD-10-CM | POA: Diagnosis not present

## 2018-05-07 DIAGNOSIS — R102 Pelvic and perineal pain: Secondary | ICD-10-CM | POA: Diagnosis not present

## 2018-05-07 NOTE — Progress Notes (Signed)
HPI: [name] returns today now approximately 11 weeks status post conservative treatment of a nondisplaced fracture of the left fifth metatarsal base and right rami fractures.  At this point time she is in a regular shoe on the left foot.  She does not require any type of assistive device.  She feels overall that she is greatly improved.  She denies any pain in the right pelvis or the left foot.  Physical exam: Right hip good range of motion without pain.  Ambulates without assistive device or an antalgic gait.  Left foot she is nontender over the base of fifth metatarsal.  She is able to evert the foot.  No rashes skin lesions ulcerations.  Radiographs:AP pelvis: Interval considerable consolidation of the rami fractures on the right.  Otherwise bilateral hips well located no other acute fractures. Right foot 3 views: Fifth metatarsal fractures continues to heal and remains nondisplaced.  Fracture site is still evident with no.  No other fractures identified.  Impression: Closed fracture right pubic rami Nondisplaced left fifth metatarsal fracture  Plan: This point time she will continue to work on range of motion strengthening.  Did discuss with she and her daughter that we could re-x-ray her left foot in 4 weeks to check for further consolidation but due to the fact that she has no pain and is ambulating without pain it would not change our treatment.  Therefore they have opted to discuss follow-up on an as-needed basis if there is any questions or concerns they definitely can return.

## 2018-11-25 IMAGING — DX DG HIP (WITH OR WITHOUT PELVIS) 2-3V*R*
3 series · 3 of 3 positions shown · non-contrast
Comparison: None.

CLINICAL DATA: 76-year-old who fell and complains of RIGHT hip
pain. Initial encounter.

EXAM:
DG HIP (WITH OR WITHOUT PELVIS) 2-3V RIGHT

[t pelvis ap]
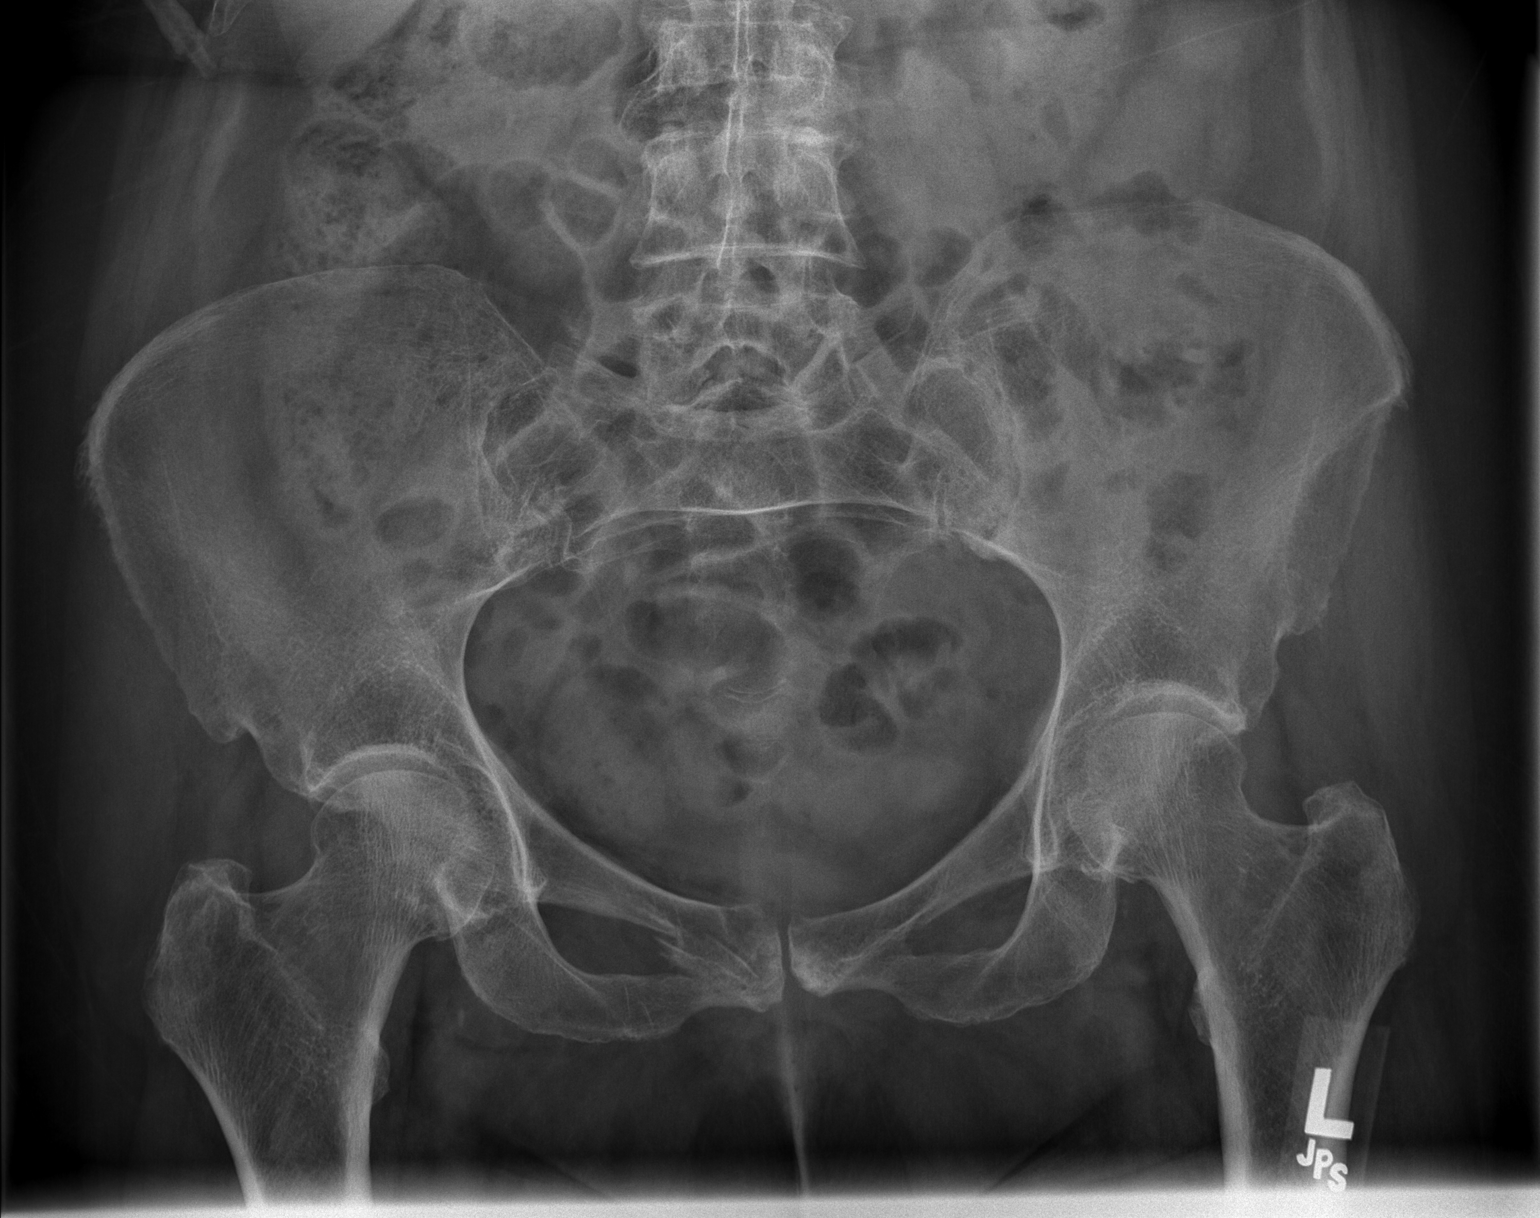

[t hip ap right]
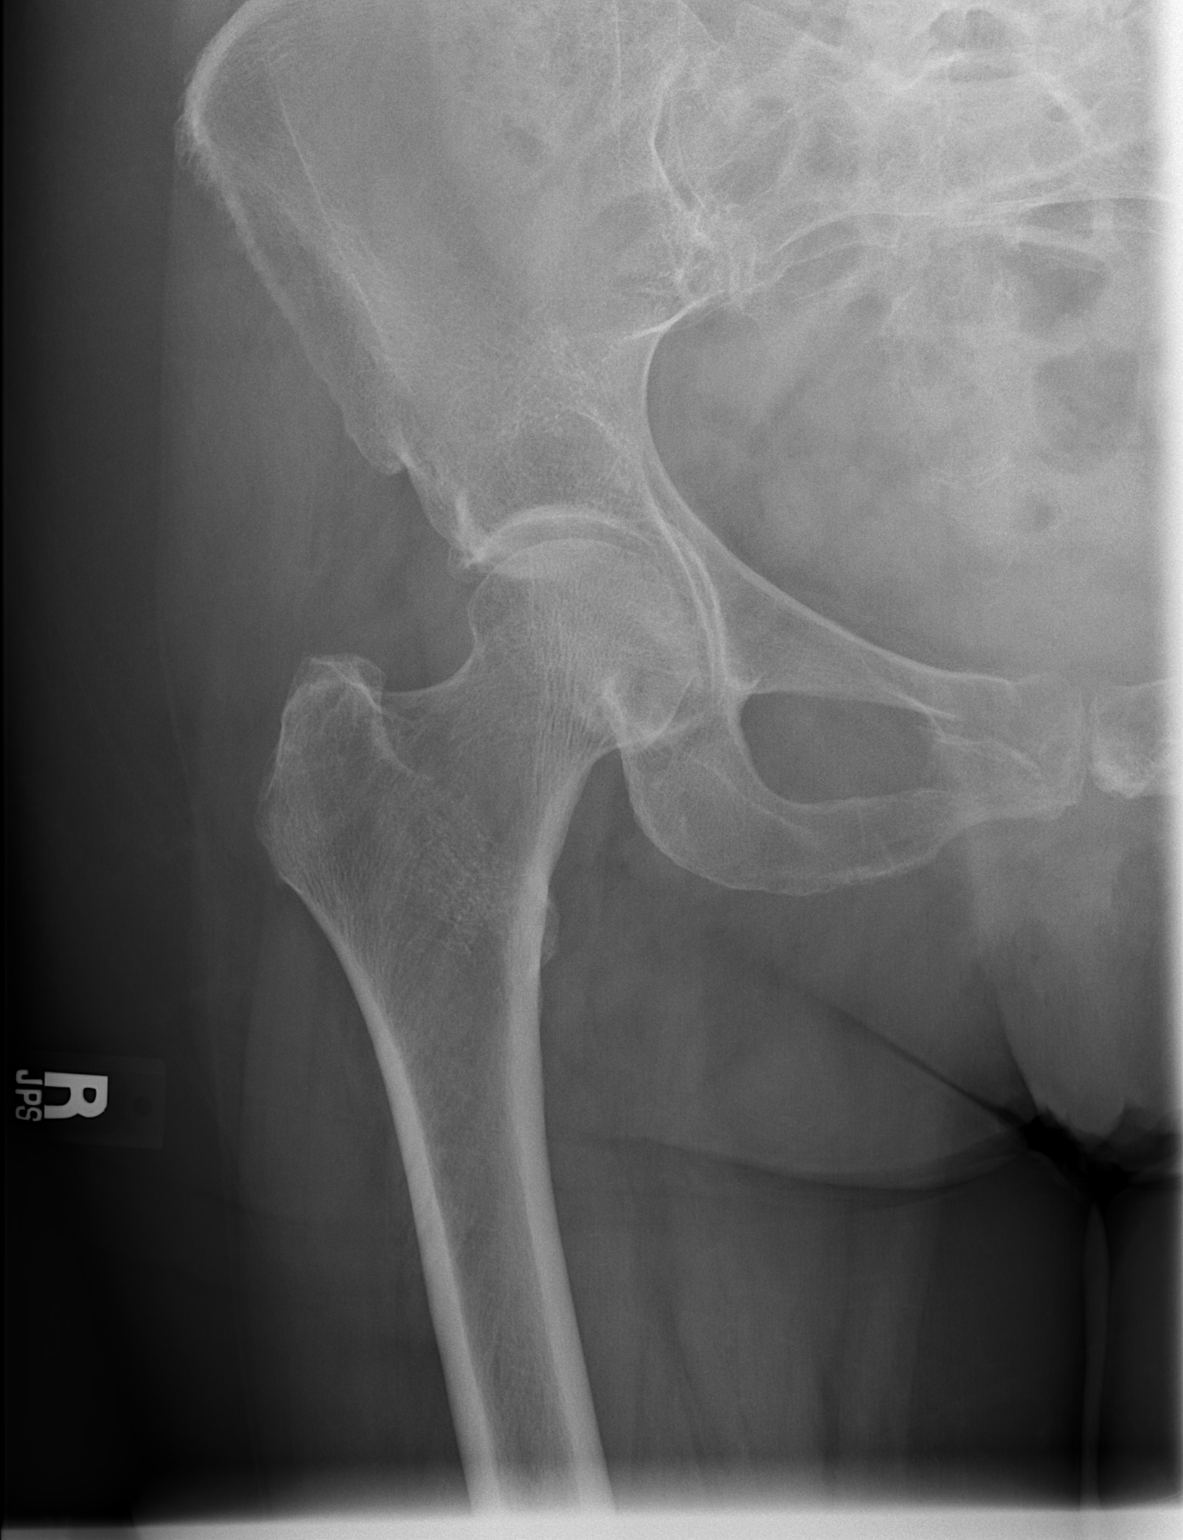

[t hip frog leg right]
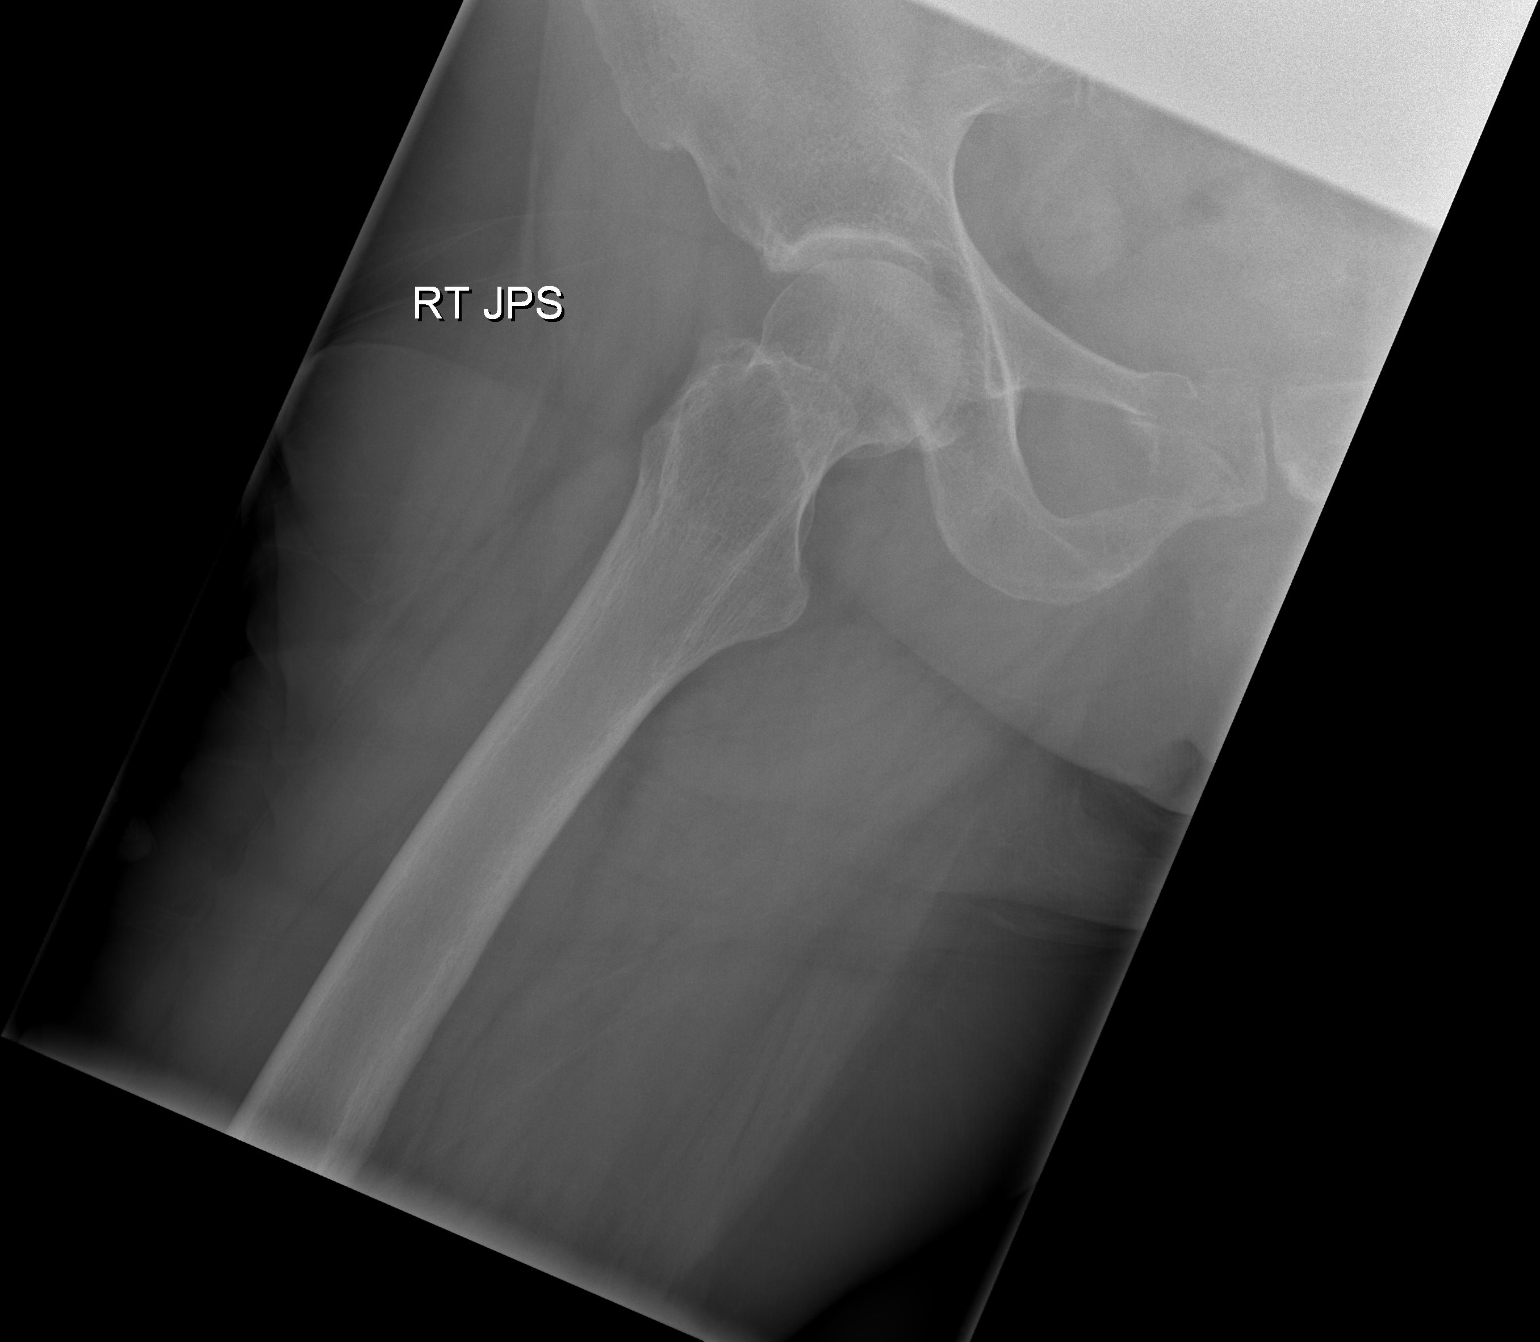

[3 of 3 positions shown; findings below may reference images not displayed]

FINDINGS: Acute fracture involving the RIGHT pubic bone with likely
involvement of the MEDIAL aspect of the SUPERIOR and INFERIOR pubic
rami. No fractures elsewhere. RIGHT hip joint anatomically aligned
with mild joint space narrowing.

Included AP pelvis demonstrates symmetric mild joint space narrowing
in the contralateral LEFT hip. Sacroiliac joints intact. Symphysis
pubis intact with degenerative changes. Degenerative changes
involving the visualized LOWER lumbar spine.
IMPRESSION: Acute fracture involving the RIGHT pubic bone with likely
involvement of the MEDIAL aspect of the SUPERIOR and INFERIOR pubic
rami.

## 2023-09-23 ENCOUNTER — Emergency Department (HOSPITAL_COMMUNITY)

## 2023-09-23 ENCOUNTER — Emergency Department (HOSPITAL_COMMUNITY): Admission: EM | Admit: 2023-09-23 | Discharge: 2023-09-23 | Disposition: A | Discharge status: Home / Self Care

## 2023-09-23 DIAGNOSIS — S32010A Wedge compression fracture of first lumbar vertebra, initial encounter for closed fracture: Secondary | ICD-10-CM

## 2023-09-23 DIAGNOSIS — Z79899 Other long term (current) drug therapy: Secondary | ICD-10-CM | POA: Diagnosis not present

## 2023-09-23 DIAGNOSIS — W19XXXA Unspecified fall, initial encounter: Secondary | ICD-10-CM | POA: Diagnosis not present

## 2023-09-23 DIAGNOSIS — S32019A Unspecified fracture of first lumbar vertebra, initial encounter for closed fracture: Secondary | ICD-10-CM | POA: Insufficient documentation

## 2023-09-23 DIAGNOSIS — S3992XA Unspecified injury of lower back, initial encounter: Secondary | ICD-10-CM | POA: Diagnosis present

## 2023-09-23 MED ORDER — OXYCODONE-ACETAMINOPHEN 5-325 MG PO TABS: 1.0000 | ORAL_TABLET | Freq: Four times a day (QID) | ORAL | 0 refills | Status: AC | PRN

## 2023-09-23 MED ORDER — OXYCODONE-ACETAMINOPHEN 5-325 MG PO TABS: 1.0000 | ORAL_TABLET | Freq: Once | ORAL | Status: DC

## 2023-09-23 MED ORDER — OXYCODONE-ACETAMINOPHEN 5-325 MG PO TABS
1.0000 | ORAL_TABLET | Freq: Once | ORAL | Status: AC
  Administered 2023-09-23: 1 via ORAL
  Filled 2023-09-23: qty 1

## 2023-09-23 NOTE — ED Notes (Signed)
 Ortho called for brace to be applied

## 2023-09-23 NOTE — ED Provider Notes (Signed)
 [place] [facility] AT [facility] Provider Note   CSN: [id number] Arrival date & time: 09/23/23  1029     History Chief Complaint  Patient presents with   Fall   Back Pain    Pamela Huffman is a 83 y.o. female.  Patient presents to the emergency department concerns of a fall and back pain.  Reportedly about 1 week ago had a mechanical fall in which she struck her low back against a table.  She now has some pain in the low back with some radiation down the left leg more so in the right leg.  Denies any saddle paresthesia, bowel or bladder incontinence.  No red flag signs.   Fall  Back Pain      Home Medications Prior to Admission medications   Medication Sig Start Date End Date Taking? Authorizing Provider  oxyCODONE-acetaminophen (PERCOCET/ROXICET) 5-325 MG tablet Take 1 tablet by mouth every 6 (six) hours as needed for severe pain (pain score 7-10). 09/23/23  Yes Wednesday Ericsson, Barnetta Chapel, PA-C  lisinopril-hydrochlorothiazide (PRINZIDE,ZESTORETIC) 20-25 MG tablet Take 1 tablet by mouth daily. 12/16/17   [provider]      Allergies    Codeine    Review of Systems   Review of Systems  Musculoskeletal:  Positive for back pain.  All other systems reviewed and are negative.   Physical Exam Updated Vital Signs BP 139/76 (BP Location: Right Leg)   Pulse 74   Temp 98.1 F (36.7 C) (Oral)   Resp 17   SpO2 96%  Physical Exam Vitals and nursing note reviewed.  Constitutional:      General: She is not in acute distress.    Appearance: She is well-developed.  HENT:     Head: Normocephalic and atraumatic.  Eyes:     Conjunctiva/sclera: Conjunctivae normal.  Cardiovascular:     Rate and Rhythm: Normal rate and regular rhythm.     Heart sounds: No murmur heard. Pulmonary:     Effort: Pulmonary effort is normal. No respiratory distress.     Breath sounds: Normal breath sounds.  Abdominal:     Palpations: Abdomen is soft.     Tenderness: There is  no abdominal tenderness.  Musculoskeletal:        General: Tenderness and signs of injury present. No swelling.       Arms:     Cervical back: Neck supple.  Skin:    General: Skin is warm and dry.     Capillary Refill: Capillary refill takes less than 2 seconds.  Neurological:     Mental Status: She is alert.     Motor: No weakness.     Comments: RLE and LLE 5/5 strength, sensation at baseline   Psychiatric:        Mood and Affect: Mood normal.     ED Results / Procedures / Treatments   Labs (all labs ordered are listed, but only abnormal results are displayed) Labs Reviewed - No data to display   EKG None  Radiology MR LUMBAR SPINE WO CONTRAST Result Date: 09/23/2023 CLINICAL DATA:  Trauma EXAM: MRI LUMBAR SPINE WITHOUT CONTRAST TECHNIQUE: Multiplanar, multisequence MR imaging of the lumbar spine was performed. No intravenous contrast was administered. COMPARISON:  Lumbar spine CT 09/23/2023 FINDINGS: Segmentation:  Standard. Alignment:  Physiologic. Vertebrae: There is a wedge compression deformity at T11 and incomplete burst fracture at L1. There is no bone marrow edema at T11. There is superior endplate edema at L1 with an underlying cleft.  There is retropulsion of 4 mm. Conus medullaris and cauda equina: Conus extends to the L1 level. Conus and cauda equina appear normal. Paraspinal and other soft tissues: Negative Disc levels: T10-11: Rare projection of superior T11 endplate there is a ventral thecal sac without causing spinal canal stenosis. T11-12: Unremarkable. T12-L1: Retropulsion of the superior L1 endplate causes mild spinal canal stenosis. Severe bilateral neural foraminal stenosis. L1-L2: Normal disc space and facet joints. No spinal canal stenosis. No neural foraminal stenosis. L2-L3: Normal disc space and facet joints. No spinal canal stenosis. No neural foraminal stenosis. L3-L4: Intermediate sized disc bulge with endplate spurring and mild facet arthrosis. Mild spinal  canal stenosis. Narrowing of both lateral recesses and mild bilateral neural foraminal stenosis. L4-L5: Small disc bulge with mild facet arthrosis. Narrowing of both lateral recesses without central spinal canal stenosis. No neural foraminal stenosis. L5-S1: Small disc bulge. No spinal canal stenosis. No neural foraminal stenosis. Visualized sacrum: Normal. IMPRESSION: 1. Acute/subacute incomplete burst fracture of L1 with retropulsion of 4 mm and mild spinal canal stenosis. 2. Severe bilateral T12-L1 neural foraminal stenosis. 3. Mild spinal canal stenosis and mild bilateral neural foraminal stenosis at L3-L4. 4. Narrowing of both lateral recesses at L3-L4 and L4-L5 may affect the descending L4 and L5 nerve roots, respectively. 5. T11 wedge compression fracture is likely chronic. Electronically Signed   By: Deatra Robinson M.D.   On: 09/23/2023 17:25   CT Lumbar Spine Wo Contrast Result Date: 09/23/2023 CLINICAL DATA:  Trauma, fall last week. EXAM: CT LUMBAR SPINE WITHOUT CONTRAST TECHNIQUE: Multidetector CT imaging of the lumbar spine was performed without intravenous contrast administration. Multiplanar CT image reconstructions were also generated. RADIATION DOSE REDUCTION: This exam was performed according to the departmental dose-optimization program which includes automated exposure control, adjustment of the mA and/or kV according to patient size and/or use of iterative reconstruction technique. COMPARISON:  Lumbar spine radiograph 09/17/2023 FINDINGS: Segmentation: 5 lumbar type vertebrae. Alignment: Lumbar lordosis is maintained. Trace retrolisthesis of L1 on L2. Vertebrae: Compression deformity of L1 with up to 50% height loss anteriorly. Lucency extending through the vertebral body involving the anterior and posterior cortices as well as the superior endplate. No evidence of pedicle involvement. Associated 5 mm retropulsion of fracture fragments along the superior margin of L1. Additional 5 mm anterior  displacement of fracture fragments along the anterior superior endplate. Additional likely chronic compression deformity of L3 with 40% height loss centrally. 4 mm retropulsion along the inferior margin of L3. Vertebral body heights otherwise maintained. No suspicious osseous lesion. Paraspinal and other soft tissues: There is mild soft tissue swelling along the anterior aspect of the L1 vertebral body. Mild paraspinal muscular atrophy. Mild atherosclerosis of the abdominal aorta and branch vessels. 5 mm nonobstructing calculus in the lower pole of the right kidney. Possible mild prominence of the right renal collecting system. Partially visualized left renal cyst. Disc levels: T12-L1: Minimal disc bulge. Retropulsed fracture fragments result in mild spinal canal stenosis. No significant foraminal stenosis. L1-2: Disc bulge and posterior osteophytes along with facet arthrosis resulting in mild spinal canal stenosis. Mild foraminal stenosis on the left. L2-3: Small disc bulge and bilateral facet arthrosis resulting in mild spinal canal stenosis. No significant foraminal stenosis. L3-4: Disc bulge, posterior osteophytes and retropulsed fracture fragments along with facet arthrosis and thickening of the ligamentum flavum resulting in moderate spinal canal stenosis. Mild-to-moderate bilateral foraminal stenosis. L4-5: Diffuse disc bulge, facet arthrosis, and thickening of the ligamentum flavum resulting in moderate spinal canal  stenosis. No significant foraminal stenosis. L5-S1: Small disc bulge slightly eccentric to the left. Bilateral facet arthrosis. Mild spinal canal stenosis. No significant foraminal stenosis. IMPRESSION: 1. Compression deformity of L1 with up to 50% height loss anteriorly. Findings may reflect acute on chronic compression deformity. Recommend MRI for further evaluation. 2. 5 mm retropulsion at L1 contributing to mild spinal canal stenosis. 3. Additional likely chronic compression deformity of L3  with 40% height loss centrally. 4 mm retropulsion along the inferior margin of L3. 4. Multilevel degenerative changes of the lumbar spine, as described. Moderate spinal canal stenosis at L3-4 and L4-5. 5. 5 mm non-obstructing calculus in the lower pole of the right kidney. Possible mild prominence of the right renal collecting system. Consider renal ultrasound for further evaluation. 6. Aortic Atherosclerosis (ICD10-I70.0). Electronically Signed   By: Emily Filbert M.D.   On: 09/23/2023 14:07    Procedures Procedures    Medications Ordered in ED Medications  oxyCODONE-acetaminophen (PERCOCET/ROXICET) 5-325 MG per tablet 1 tablet (1 tablet Oral Given 09/23/23 1256)    ED Course/ Medical Decision Making/ A&P                                 Medical Decision Making Amount and/or Complexity of Data Reviewed Labs: ordered. Radiology: ordered.  Risk Prescription drug management.   This patient presents to the ED for concern of fall, back pain.  Differential diagnosis includes compression fracture, traumatic listhesis, cauda equina syndrome   Imaging Studies ordered:  I ordered imaging studies including CT lumbar, MR lumbar I independently visualized and interpreted imaging which showed 1. Compression deformity of L1 with up to 50% height loss anteriorly. Findings may reflect acute on chronic compression deformity. Recommend MRI for further evaluation. 2. 5 mm retropulsion at L1 contributing to mild spinal canal stenosis. 3. Additional likely chronic compression deformity of L3 with 40% height loss centrally. 4 mm retropulsion along the inferior margin of L3. 4. Multilevel degenerative changes of the lumbar spine, as described. Moderate spinal canal stenosis at L3-4 and L4-5. 5. 5 mm non-obstructing calculus in the lower pole of the right kidney. Possible mild prominence of the right renal collecting system. Consider renal ultrasound for further evaluation. 6. Aortic Atherosclerosis  (ICD10-I70.0).1. Acute/subacute incomplete burst fracture of L1 with retropulsion of 4 mm and mild spinal canal stenosis. 2. Severe bilateral T12-L1 neural foraminal stenosis. 3. Mild spinal canal stenosis and mild bilateral neural foraminal stenosis at L3-L4. 4. Narrowing of both lateral recesses at L3-L4 and L4-L5 may affect the descending L4 and L5 nerve roots, respectively. 5. T11 wedge compression fracture is likely chronic. I agree with the radiologist interpretation   Medicines ordered and prescription drug management:  I ordered medication including Percocet  for pain  Reevaluation of the patient after these medicines showed that the patient improved I have reviewed the patients home medicines and have made adjustments as needed   Problem List / ED Course:  Patient presents to the ED with concerns of back pain.  Reports that she had a mechanical fall about 1 week ago and having ongoing pain in her low back.  Denies any significant numbness, tingling, paresthesias.  Able to ambulate without significant difficulty.  Concerned that the pain is not improving.  Was seen at [facility] emergency department about 1 week ago with x-ray imaging which only showed chronic fractures.  No other imaging performed at that time.  He is here  with daughter requesting advanced imaging. Physical exam is reassuring.  No evidence of cauda equina as there is no saddle paresthesia, bowel or bladder incontinence, unilateral weakness or numbness.  Patient's lower extremity strength appears to be intact at baseline. Will obtain CT imaging for assessment. CT imaging concerning for compression deformity of the L1 vertebrae with about 50% height loss anteriorly. MRI advised for further evaluation. MR lumbar shows an acute/subacute incomplete burst fracture of L1 with 4mm retropulsion along the inferior margin. Given burst fractures are considered unstable, will consult neurosurgery. TLSO ordered. Spoke with  Judith Blonder,, neurosurgery, who advised fracture can be managed with TLSO and outpatient follow up. TLSO in place. Small prescription for Percocet sent to pharmacy for severe pain.  Advised managing with Tylenol and ibuprofen for mild to moderate pain.  Patient otherwise stable for outpatient follow-up and discharged home.  Final Clinical Impression(s) / ED Diagnoses Final diagnoses:  Closed compression fracture of body of L1 vertebra (HCC)    Rx / DC Orders ED Discharge Orders          Ordered    oxyCODONE-acetaminophen (PERCOCET/ROXICET) 5-325 MG tablet  Every 6 hours PRN        09/23/23 1823              Smitty Knudsen, PA-C 09/23/23 1826    Coral Spikes, DO 09/24/23 [phone]

## 2023-09-23 NOTE — Progress Notes (Signed)
 Orthopedic Tech Progress Note Patient Details:  Pamela Huffman 1941-03-21 [id number]  Patient ID: [name], female   DOB: [birth year], 83 y.o.   MRN: [id number]  [name] 09/23/2023, 3:50 PM Tlso ordered from [facility] clinic.

## 2023-09-23 NOTE — Discharge Instructions (Addendum)
 Hoy lo atendieron en el departamento de emergencias por preocupaciones de Engineer, mining de espalda y [place]ia [place] cada.  Su tomografa computarizada y resonancia magntica muestran una fractura por compresin en las vrtebras L1.  Le colocan un aparato ortopdico TLSO.  Habl con nuestro grupo de neurociruga, quien me recomend el uso de este aparato ortopdico y un seguimiento ambulatorio en nuestro consultorio.  Comunquese con ellos para programar una cita.  Regrese al departamento de emergencias si tiene dudas sobre sntomas nuevos o que [place]. Puede alternar entre Tylenol e ibuprofeno para el dolor leve a moderado.  Le han enviado una receta de Percocet a su farmacia para tratar el dolor intenso.  Por favor, tome esto slo para dolores intensos.   You are seen in the emergency department today for concerns of back pain and a fall.  Your CT and MRI imaging shows a compression fracture at the L1 vertebrae.  You are placed into a TLSO brace.  I spoke with our neurosurgery group who advised using this brace and outpatient follow-up with our office.  Please reach out to them for an appointment.  Return the emergency department for any concerns of new or worsening symptoms. You may alternate between Tylenol and ibuprofen for mild to moderate pain.  A prescription for Percocet has been sent to your pharmacy for severe pain.  Please only take this for severe pain.

## 2023-09-23 NOTE — ED Triage Notes (Signed)
 Pt presents POV with daughter who reports having a fall on Wednesday of last week. Denies LOC, no head injury. Pt c/o lower back pain, radiating into her legs. She does report having a hx of osteoporosis and being seen last week at [facility]. There she had x-rays which showed chronic T11, L1, L3 fx. Pt's daughter reports she's having continued pain and would like to seek an MRI.

## 2023-11-26 DIAGNOSIS — I351 Nonrheumatic aortic (valve) insufficiency: Secondary | ICD-10-CM | POA: Diagnosis not present

## 2023-11-26 DIAGNOSIS — I340 Nonrheumatic mitral (valve) insufficiency: Secondary | ICD-10-CM | POA: Diagnosis not present

## 2023-11-26 DIAGNOSIS — I361 Nonrheumatic tricuspid (valve) insufficiency: Secondary | ICD-10-CM | POA: Diagnosis not present

## 2024-03-25 ENCOUNTER — Ambulatory Visit: Attending: Internal Medicine | Admitting: Internal Medicine

## 2024-03-25 ENCOUNTER — Encounter: Payer: Self-pay | Admitting: Internal Medicine

## 2024-03-25 VITALS — BP 161/67 | HR 63 | Resp 16 | Ht 59.0 in | Wt 144.0 lb

## 2024-03-25 DIAGNOSIS — S32011A Stable burst fracture of first lumbar vertebra, initial encounter for closed fracture: Secondary | ICD-10-CM | POA: Insufficient documentation

## 2024-03-25 DIAGNOSIS — E559 Vitamin D deficiency, unspecified: Secondary | ICD-10-CM

## 2024-03-25 DIAGNOSIS — M8000XS Age-related osteoporosis with current pathological fracture, unspecified site, sequela: Secondary | ICD-10-CM

## 2024-03-25 DIAGNOSIS — M810 Age-related osteoporosis without current pathological fracture: Secondary | ICD-10-CM | POA: Insufficient documentation

## 2024-03-25 NOTE — Progress Notes (Signed)
 Office Visit Note  Patient: Pamela Huffman             Date of Birth: 1940-12-02           MRN: [id number]             PCP: Marelyn Axon, MD Referring: Marelyn Quill, MD Visit Date: 03/25/2024   Subjective:  Discussed the use of AI scribe software for clinical note transcription with the patient, who gave verbal consent to proceed.  History of Present Illness   Pamela Huffman is an 83 year old female with osteoporosis who presents for evaluation of her condition and related fractures. She is accompanied by her daughter, who also serves as her interpreter at her preference. She was referred by her primary care office for evaluation of her osteoporosis.  She has a history of osteoporosis, diagnosed after experiencing multiple fractures. In 2013, she had four collapsed vertebrae. In 2019, she broke her pelvis, and earlier this year, she sustained a burst fracture of the L1 vertebra with associated spinal cord compression.  She experiences back spasms and joint pain following her vertebral fracture six months ago. She also had a minor foot fracture concurrent with her pelvic fracture, which healed without intervention. She has not used injectable osteoporosis medications due to fear of injections and side effects. She has not experienced fractures outside of the spine or pelvis, except for the minor foot fracture.  Her current medications include ibandronate once a month, which she switched to from a weekly regimen due to significant stomach issues, including gas and burping.  No history of heart disease beyond a heart murmur.      Activities of Daily Living:  Patient reports morning stiffness for 2-3 minutes.   Patient Denies nocturnal pain.  Difficulty dressing/grooming: Denies Difficulty climbing stairs: Reports Difficulty getting out of chair: Reports Difficulty using hands for taps, buttons, cutlery, and/or writing: Reports  Review of Systems  Constitutional:  Negative for  fatigue.  HENT:  Positive for mouth dryness. Negative for mouth sores.   Eyes:  Positive for dryness.  Respiratory:  Negative for shortness of breath.   Cardiovascular:  Negative for chest pain and palpitations.  Gastrointestinal:  Negative for blood in stool, constipation and diarrhea.  Endocrine: Negative for increased urination.  Genitourinary:  Negative for involuntary urination.  Musculoskeletal:  Positive for joint pain, gait problem, joint pain, myalgias, morning stiffness, muscle tenderness and myalgias. Negative for joint swelling and muscle weakness.  Skin:  Negative for color change, rash, hair loss and sensitivity to sunlight.  Allergic/Immunologic: Negative for susceptible to infections.  Neurological:  Negative for dizziness and headaches.  Hematological:  Negative for swollen glands.  Psychiatric/Behavioral:  Positive for sleep disturbance. Negative for depressed mood. The patient is not nervous/anxious.     PMFS History:  Patient Active Problem List   Diagnosis Date Noted   Osteoporosis 03/25/2024   Vitamin D  deficiency 03/25/2024   Closed stable burst fracture of first lumbar vertebra (HCC) 03/25/2024   Closed fracture of multiple pubic rami, right, initial encounter (HCC)    Nondisplaced fracture of fifth metatarsal bone, left foot, initial encounter for closed fracture    Pelvic fracture (HCC) 02/11/2018    Past Medical History:  Diagnosis Date   Hypertension    Osteoporosis     Family History  Problem Relation Age of Onset   Diabetes Mother    Lung cancer Father    Parkinson's disease Brother    Bipolar disorder Son  Healthy Daughter    Past Surgical History:  Procedure Laterality Date   DILATION AND CURETTAGE OF UTERUS     TONSILLECTOMY     Social History   Social History Narrative   Not on file    There is no immunization history on file for this patient.   Objective: Vital Signs: BP (!) 161/67 (BP Location: Left Arm, Patient Position:  Sitting, Cuff Size: Normal)   Pulse 63   Resp 16   Ht 4' 11 (1.499 m)   Wt 144 lb (65.3 kg)   BMI 29.08 kg/m    Physical Exam Eyes:     Conjunctiva/sclera: Conjunctivae normal.  Cardiovascular:     Rate and Rhythm: Normal rate and regular rhythm.     Heart sounds: Murmur (Systolic murmur LUSB/RUSB) heard.  Pulmonary:     Effort: Pulmonary effort is normal.     Breath sounds: Normal breath sounds.  Musculoskeletal:     Right lower leg: No edema.     Left lower leg: No edema.  Skin:    General: Skin is warm and dry.  Neurological:     Mental Status: She is alert.  Psychiatric:        Mood and Affect: Mood normal.      Musculoskeletal Exam:  Shoulders full ROM no tenderness or swelling Elbows full ROM no tenderness or swelling Wrists full ROM no tenderness or swelling Fingers full ROM no tenderness or swelling Increased kyphosis with decreased extension ROM Knees full ROM no tenderness or swelling  Investigation: No additional findings.  Imaging: No results found.  Recent Labs: Lab Results  Component Value Date   WBC 8.2 02/12/2018   HGB 12.2 02/12/2018   PLT 197 02/12/2018   NA 141 03/25/2024   K 4.7 03/25/2024   CL 104 03/25/2024   CO2 31 03/25/2024   GLUCOSE 92 03/25/2024   BUN 24 03/25/2024   CREATININE 0.67 03/25/2024   CALCIUM 10.5 (H) 03/25/2024   GFRAA >60 02/12/2018    Speciality Comments: No specialty comments available.  Procedures:  No procedures performed Allergies: Codeine and Strawberry (diagnostic)   Assessment / Plan:     Visit Diagnoses: Age-related osteoporosis with current pathological fracture, sequela - Plan: Basic Metabolic Panel (BMET), VITAMIN D  25 Hydroxy (Vit-D Deficiency, Fractures), Parathyroid  hormone, intact (no Ca) Severe osteoporosis with multiple fragility fractures, including L1 burst fracture with spinal cord compression and previous pelvic fracture. Inadequate response to oral bisphosphonates due to  gastrointestinal side effects and continued fractures. T-score of -3.1 indicates high fracture risk. - Discontinue ibandronate. - Explained Evenity (romosozumab) for monthly injections over one year to increase bone density and strength. - Discussed potential side effects of Evenity, including hypocalcemia, CVD related events, and limitatio to 1 year duration treatment before anticipating switch to maintenance - Order blood tests to check metabolic panel and vitamin D  levels before starting new treatment. - Recheck labs one month after starting new treatment to monitor for changes. - Provided drug information handouts for Evenity and Forteo/Tymlos. - Discussed the need for calcium and vitamin D  supplementation to support bone health.    Orders: Orders Placed This Encounter  Procedures   Basic Metabolic Panel (BMET)   VITAMIN D  25 Hydroxy (Vit-D Deficiency, Fractures)   Parathyroid  hormone, intact (no Ca)   No orders of the defined types were placed in this encounter.    Follow-Up Instructions: Return in about 4 months (around 07/25/2024) for New pt OP start evenity or forteo? f/u 2mos.  Lonni LELON Ester, MD  Note - This record has been created using [employer].  Chart creation errors have been sought, but may not always  have been located. Such creation errors do not reflect on  the standard of medical care.

## 2024-03-26 LAB — BASIC METABOLIC PANEL WITH GFR
BUN: 24 mg/dL (ref 7–25)
CO2: 31 mmol/L (ref 20–32)
Calcium: 10.5 mg/dL — ABNORMAL HIGH (ref 8.6–10.4)
Chloride: 104 mmol/L (ref 98–110)
Creat: 0.67 mg/dL (ref 0.60–0.95)
Glucose, Bld: 92 mg/dL (ref 65–99)
Potassium: 4.7 mmol/L (ref 3.5–5.3)
Sodium: 141 mmol/L (ref 135–146)
eGFR: 88 mL/min/1.73m2 (ref >=60)

## 2024-03-26 LAB — VITAMIN D 25 HYDROXY (VIT D DEFICIENCY, FRACTURES): Vit D, 25-Hydroxy: 90 ng/mL (ref 30–100)

## 2024-03-26 LAB — PARATHYROID HORMONE, INTACT (NO CA): PTH: 16 pg/mL (ref 16–77)

## 2024-03-27 ENCOUNTER — Ambulatory Visit: Payer: Self-pay | Admitting: Internal Medicine

## 2024-03-27 NOTE — Progress Notes (Signed)
 Lab results look fine, her calcium is slightly high at 10.5 and vitamin D  of 90 is at the high end of normal. I think she can continue her current supplementation.  Were they able to review medication options and have any decision about Evenity or Forteo as an option for osteoporosis?

## 2024-04-23 ENCOUNTER — Ambulatory Visit: Admitting: Internal Medicine

## 2024-06-21 NOTE — Progress Notes (Deleted)
 Office Visit Note  Patient: Pamela Huffman             Date of Birth: [birth year]           MRN: [id number]             PCP: Marelyn Axon, MD Referring: Marelyn Axon, MD Visit Date: 06/22/2024   Subjective:  No chief complaint on file.   History of Present Illness: Pamela Huffman is a 83 y.o. female here for follow up ***   Previous HPI 03/25/24 Pamela Huffman is an 83 year old female with osteoporosis who presents for evaluation of her condition and related fractures. She is accompanied by her daughter, who also serves as her interpreter at her preference. She was referred by her primary care office for evaluation of her osteoporosis.   She has a history of osteoporosis, diagnosed after experiencing multiple fractures. In 2013, she had four collapsed vertebrae. In 2019, she broke her pelvis, and earlier this year, she sustained a burst fracture of the L1 vertebra with associated spinal cord compression.   She experiences back spasms and joint pain following her vertebral fracture six months ago. She also had a minor foot fracture concurrent with her pelvic fracture, which healed without intervention. She has not used injectable osteoporosis medications due to fear of injections and side effects. She has not experienced fractures outside of the spine or pelvis, except for the minor foot fracture.   Her current medications include ibandronate once a month, which she switched to from a weekly regimen due to significant stomach issues, including gas and burping.   No history of heart disease beyond a heart murmur.    No Rheumatology ROS completed.   PMFS History:  Patient Active Problem List   Diagnosis Date Noted   Osteoporosis 03/25/2024   Vitamin D  deficiency 03/25/2024   Closed stable burst fracture of first lumbar vertebra (HCC) 03/25/2024   Closed fracture of multiple pubic rami, right, initial encounter (HCC)    Nondisplaced fracture of fifth metatarsal bone, left foot,  initial encounter for closed fracture    Pelvic fracture (HCC) 02/11/2018    Past Medical History:  Diagnosis Date   Hypertension    Osteoporosis     Family History  Problem Relation Age of Onset   Diabetes Mother    Lung cancer Father    Parkinson's disease Brother    Bipolar disorder Son    Healthy Daughter    Past Surgical History:  Procedure Laterality Date   DILATION AND CURETTAGE OF UTERUS     TONSILLECTOMY     Social History   Social History Narrative   Not on file   Immunization History  Administered Date(s) Administered   Moderna Sars-Covid-2 Vaccination 06/29/2020     Objective: Vital Signs: There were no vitals taken for this visit.   Physical Exam   Musculoskeletal Exam: ***  CDAI Exam: CDAI Score: -- Patient Global: --; Provider Global: -- Swollen: --; Tender: -- Joint Exam 06/22/2024   No joint exam has been documented for this visit   There is currently no information documented on the homunculus. Go to the Rheumatology activity and complete the homunculus joint exam.  Investigation: No additional findings.  Imaging: No results found.  Recent Labs: Lab Results  Component Value Date   WBC 8.2 02/12/2018   HGB 12.2 02/12/2018   PLT 197 02/12/2018   NA 141 03/25/2024   K 4.7 03/25/2024   CL 104 03/25/2024   CO2 31  03/25/2024   GLUCOSE 92 03/25/2024   BUN 24 03/25/2024   CREATININE 0.67 03/25/2024   CALCIUM 10.5 (H) 03/25/2024   GFRAA >60 02/12/2018    Speciality Comments: No specialty comments available.  Procedures:  No procedures performed Allergies: Codeine and Strawberry (diagnostic)   Assessment / Plan:     Visit Diagnoses: No diagnosis found.  ***  Orders: No orders of the defined types were placed in this encounter.  No orders of the defined types were placed in this encounter.    Follow-Up Instructions: No follow-ups on file.   Lonni LELON Ester, MD  Note - This record has been created using [employer].  Chart creation errors have been sought, but may not always  have been located. Such creation errors do not reflect on  the standard of medical care.

## 2024-06-22 ENCOUNTER — Ambulatory Visit: Admitting: Internal Medicine
